# Patient Record
Sex: Male | Born: 1940
Health system: Southern US, Community
[De-identification: ages and names within clinical notes are randomized; demographics above are authoritative.]

## PROBLEM LIST (undated history)

## (undated) DIAGNOSIS — F329 Major depressive disorder, single episode, unspecified: Secondary | ICD-10-CM

## (undated) DIAGNOSIS — F32A Depression, unspecified: Secondary | ICD-10-CM

## (undated) DIAGNOSIS — N390 Urinary tract infection, site not specified: Secondary | ICD-10-CM

## (undated) DIAGNOSIS — F419 Anxiety disorder, unspecified: Secondary | ICD-10-CM

## (undated) DIAGNOSIS — E119 Type 2 diabetes mellitus without complications: Secondary | ICD-10-CM

## (undated) DIAGNOSIS — Z789 Other specified health status: Secondary | ICD-10-CM

## (undated) DIAGNOSIS — T4145XA Adverse effect of unspecified anesthetic, initial encounter: Secondary | ICD-10-CM

## (undated) DIAGNOSIS — F4310 Post-traumatic stress disorder, unspecified: Secondary | ICD-10-CM

## (undated) DIAGNOSIS — Z794 Long term (current) use of insulin: Secondary | ICD-10-CM

## (undated) DIAGNOSIS — M1990 Unspecified osteoarthritis, unspecified site: Secondary | ICD-10-CM

## (undated) DIAGNOSIS — R51 Headache: Secondary | ICD-10-CM

## (undated) DIAGNOSIS — K219 Gastro-esophageal reflux disease without esophagitis: Secondary | ICD-10-CM

## (undated) DIAGNOSIS — K2270 Barrett's esophagus without dysplasia: Secondary | ICD-10-CM

## (undated) DIAGNOSIS — IMO0001 Reserved for inherently not codable concepts without codable children: Secondary | ICD-10-CM

## (undated) DIAGNOSIS — T8859XA Other complications of anesthesia, initial encounter: Secondary | ICD-10-CM

## (undated) DIAGNOSIS — G4730 Sleep apnea, unspecified: Secondary | ICD-10-CM

## (undated) DIAGNOSIS — I10 Essential (primary) hypertension: Secondary | ICD-10-CM

## (undated) DIAGNOSIS — E7800 Pure hypercholesterolemia, unspecified: Secondary | ICD-10-CM

## (undated) DIAGNOSIS — J189 Pneumonia, unspecified organism: Secondary | ICD-10-CM

## (undated) HISTORY — PX: PENILE PROSTHESIS IMPLANT: SHX240

## (undated) HISTORY — PX: CYSTOSCOPY WITH HYDRODISTENSION AND BIOPSY: SHX5127

## (undated) HISTORY — PX: REFRACTIVE SURGERY: SHX103

---

## 1998-10-11 ENCOUNTER — Encounter: Payer: Self-pay | Admitting: *Deleted

## 1998-10-11 ENCOUNTER — Ambulatory Visit (HOSPITAL_COMMUNITY): Admission: RE | Admit: 1998-10-11 | Discharge: 1998-10-11 | Payer: Self-pay | Admitting: *Deleted

## 1998-11-18 ENCOUNTER — Encounter: Payer: Self-pay | Admitting: Endocrinology

## 1998-11-18 ENCOUNTER — Ambulatory Visit (HOSPITAL_COMMUNITY): Admission: RE | Admit: 1998-11-18 | Discharge: 1998-11-18 | Payer: Self-pay | Admitting: Endocrinology

## 1998-11-21 ENCOUNTER — Ambulatory Visit (HOSPITAL_COMMUNITY): Admission: RE | Admit: 1998-11-21 | Discharge: 1998-11-21 | Payer: Self-pay | Admitting: Endocrinology

## 1998-11-21 ENCOUNTER — Encounter: Payer: Self-pay | Admitting: Endocrinology

## 1998-12-06 ENCOUNTER — Ambulatory Visit (HOSPITAL_COMMUNITY): Admission: RE | Admit: 1998-12-06 | Discharge: 1998-12-06 | Payer: Self-pay | Admitting: Urology

## 1998-12-06 ENCOUNTER — Encounter: Payer: Self-pay | Admitting: Urology

## 1999-08-08 ENCOUNTER — Encounter: Payer: Self-pay | Admitting: Urology

## 1999-08-12 ENCOUNTER — Ambulatory Visit (HOSPITAL_COMMUNITY): Admission: RE | Admit: 1999-08-12 | Discharge: 1999-08-12 | Payer: Self-pay | Admitting: Urology

## 1999-08-25 ENCOUNTER — Encounter: Admission: RE | Admit: 1999-08-25 | Discharge: 1999-11-23 | Payer: Self-pay | Admitting: Endocrinology

## 1999-09-09 ENCOUNTER — Ambulatory Visit (HOSPITAL_COMMUNITY): Admission: RE | Admit: 1999-09-09 | Discharge: 1999-09-10 | Payer: Self-pay | Admitting: Urology

## 1999-12-08 ENCOUNTER — Encounter: Admission: RE | Admit: 1999-12-08 | Discharge: 2000-03-07 | Payer: Self-pay | Admitting: Endocrinology

## 2000-05-14 ENCOUNTER — Encounter: Admission: RE | Admit: 2000-05-14 | Discharge: 2000-08-12 | Payer: Self-pay | Admitting: Endocrinology

## 2002-08-20 ENCOUNTER — Emergency Department (HOSPITAL_COMMUNITY): Admission: EM | Admit: 2002-08-20 | Discharge: 2002-08-20 | Payer: Self-pay | Admitting: Emergency Medicine

## 2005-01-12 ENCOUNTER — Emergency Department (HOSPITAL_COMMUNITY): Admission: EM | Admit: 2005-01-12 | Discharge: 2005-01-12 | Payer: Self-pay | Admitting: Emergency Medicine

## 2005-01-12 ENCOUNTER — Encounter (INDEPENDENT_AMBULATORY_CARE_PROVIDER_SITE_OTHER): Payer: Self-pay | Admitting: *Deleted

## 2005-01-12 ENCOUNTER — Ambulatory Visit (HOSPITAL_COMMUNITY): Admission: RE | Admit: 2005-01-12 | Discharge: 2005-01-12 | Payer: Self-pay | Admitting: Urology

## 2005-01-12 ENCOUNTER — Ambulatory Visit (HOSPITAL_BASED_OUTPATIENT_CLINIC_OR_DEPARTMENT_OTHER): Admission: RE | Admit: 2005-01-12 | Discharge: 2005-01-12 | Payer: Self-pay | Admitting: Urology

## 2005-05-20 ENCOUNTER — Ambulatory Visit (HOSPITAL_COMMUNITY): Admission: RE | Admit: 2005-05-20 | Discharge: 2005-05-20 | Payer: Self-pay | Admitting: *Deleted

## 2005-05-20 ENCOUNTER — Encounter (INDEPENDENT_AMBULATORY_CARE_PROVIDER_SITE_OTHER): Payer: Self-pay | Admitting: Specialist

## 2005-10-07 ENCOUNTER — Encounter (INDEPENDENT_AMBULATORY_CARE_PROVIDER_SITE_OTHER): Payer: Self-pay | Admitting: *Deleted

## 2005-10-07 ENCOUNTER — Ambulatory Visit (HOSPITAL_BASED_OUTPATIENT_CLINIC_OR_DEPARTMENT_OTHER): Admission: RE | Admit: 2005-10-07 | Discharge: 2005-10-07 | Payer: Self-pay | Admitting: Urology

## 2006-03-23 ENCOUNTER — Ambulatory Visit (HOSPITAL_BASED_OUTPATIENT_CLINIC_OR_DEPARTMENT_OTHER): Admission: RE | Admit: 2006-03-23 | Discharge: 2006-03-23 | Payer: Self-pay | Admitting: Urology

## 2006-05-22 ENCOUNTER — Emergency Department (HOSPITAL_COMMUNITY): Admission: EM | Admit: 2006-05-22 | Discharge: 2006-05-22 | Payer: Self-pay | Admitting: Emergency Medicine

## 2006-08-11 ENCOUNTER — Ambulatory Visit (HOSPITAL_COMMUNITY): Admission: RE | Admit: 2006-08-11 | Discharge: 2006-08-11 | Payer: Self-pay | Admitting: *Deleted

## 2006-08-11 ENCOUNTER — Encounter (INDEPENDENT_AMBULATORY_CARE_PROVIDER_SITE_OTHER): Payer: Self-pay | Admitting: Specialist

## 2006-12-07 ENCOUNTER — Encounter: Admission: RE | Admit: 2006-12-07 | Discharge: 2007-01-20 | Payer: Self-pay | Admitting: Endocrinology

## 2008-10-09 ENCOUNTER — Ambulatory Visit (HOSPITAL_COMMUNITY): Admission: RE | Admit: 2008-10-09 | Discharge: 2008-10-09 | Payer: Self-pay | Admitting: *Deleted

## 2008-10-09 ENCOUNTER — Encounter (INDEPENDENT_AMBULATORY_CARE_PROVIDER_SITE_OTHER): Payer: Self-pay | Admitting: *Deleted

## 2009-07-06 HISTORY — PX: MOLE REMOVAL: SHX2046

## 2010-10-15 LAB — GLUCOSE, CAPILLARY: Glucose-Capillary: 103 mg/dL — ABNORMAL HIGH (ref 70–99)

## 2010-11-18 NOTE — Op Note (Signed)
Banks, Jesse                ACCOUNT NO.:  [id number]   MEDICAL RECORD NO.:  [id number]          PATIENT TYPE:  AMB   LOCATION:  ENDO                         FACILITY:  [facility]   PHYSICIAN:  Georgiana Spinner, M.D.    DATE OF BIRTH:  [birth year]   DATE OF PROCEDURE:  10/09/2008  DATE OF DISCHARGE:                               OPERATIVE REPORT   PROCEDURE:  Upper endoscopy.   INDICATIONS:  Gastroesophageal reflux disease with Barrett's esophagus.   ANESTHESIA:  Fentanyl 50 mcg, Versed 4 mg.   PROCEDURE:  With the patient mildly sedated in the left lateral  decubitus position the Pentax videoscopic endoscope was inserted into  the mouth, passed under direct vision through the esophagus which  appeared normal until we reached the distal esophagus and there were  changes of Barrett's with islands of Barrett's type tissue seen,  photographed and biopsied.  We then entered into the stomach.  The  fundus, body, antrum, duodenal bulb, second portion duodenum were  visualized. From this point the endoscope was slowly withdrawn taking  circumferential views of duodenal mucosa until the endoscope was pulled  back and the stomach placed in retroflexion to view the stomach from  below. The endoscope was straightened and withdrawn, taking  circumferential views of the remaining gastric and esophageal mucosa.  The patient's vital signs, pulse oximeter remained stable.  The patient  tolerated procedure well without apparent complication.   FINDINGS:  Barrett's esophagus, biopsied.  Await biopsy report.  The  patient will call me for results and follow-up with me as an outpatient.           ______________________________  Georgiana Spinner, M.D.     GMO/MEDQ  D:  10/09/2008  T:  10/09/2008  Job:  [id number]

## 2010-11-21 NOTE — Op Note (Signed)
Banks, DILGER NO.:  [id number]   MEDICAL RECORD NO.:  [id number]          PATIENT TYPE:  AMB   LOCATION:  ENDO                         FACILITY:  [facility]   PHYSICIAN:  Georgiana Spinner, M.D.    DATE OF BIRTH:  08-01-1940   DATE OF PROCEDURE:  08/11/2006  DATE OF DISCHARGE:                               OPERATIVE REPORT   SURGEON:  Georgiana Spinner, M.D.   PROCEDURE:  Upper endoscopy with biopsy.   INDICATIONS:  GERD with Barrett esophagus.   ANESTHESIA:  Fentanyl 50 mcg, Versed 5 mg.   PROCEDURE:  With the patient mildly sedated in the left lateral  decubitus position, the Pentax videoscopic endoscope was inserted in the  mouth and passed under direct vision through the esophagus, which  appeared normal, until we reached the distal esophagus, and there were  apparent changes of Barrett esophagus.  Photographs and biopsies were  taken around the circumference of the squamocolumnar junction.  We  entered into the stomach.  The fundus and body appeared normal.  The  antrum showed some patchy raised erythema, consistent with antral  gastritis, which was photographed and biopsied as well.  The duodenal  bulb and second portion of the duodenum were visualized and appeared  normal.  From this point, the endoscope was slowly withdrawn, taking  circumferential views of the duodenal mucosa, until the endoscope had  been pulled back into the stomach, placed in retroflexion to view the  stomach from below.  The endoscope was straightened and withdrawn,  taking circumferential views of the remaining gastric and esophageal  mucosa.  The patient's vital signs and pulse oximetry remained stable.  The patient tolerated the procedure well without apparent complication.   FINDINGS:  1. Antral gastritis, biopsied.  2. Barrett esophagus, biopsied.   PLAN:  Await biopsy report.  The patient will call for the results and  follow up with me as an outpatient.     ______________________________  Georgiana Spinner, M.D.     GMO/MEDQ  D:  08/11/2006  T:  08/11/2006  Job:  [id number]

## 2010-11-21 NOTE — Op Note (Signed)
Jesse Banks, Jesse Banks                ACCOUNT NO.:  [id number]   MEDICAL RECORD NO.:  [id number]          PATIENT TYPE:  AMB   LOCATION:  [facility]                         FACILITY:  [facility]   PHYSICIAN:  Maretta Bees. Vonita Moss, M.D.DATE OF BIRTH:  1941-05-12   DATE OF PROCEDURE:  DATE OF DISCHARGE:                                 OPERATIVE REPORT   PREOPERATIVE DIAGNOSIS:  Hematuria, rule out carcinoma in situ.   POSTOPERATIVE DIAGNOSES:  1.  Hematuria, rule out carcinoma in situ.  2.  Urethral stricture.   PROCEDURES:  1.  Cystoscopy.  2.  Urethral dilation.  3.  Cold cup bladder biopsy.  4.  Fulguration of bladder surgeon.   SURGEON:  Maretta Bees. Vonita Moss, M.D.   ANESTHESIA:  General.   INDICATIONS:  This 70 year old gentleman had an episode of gross hematuria  last month that was felt probably to be initial in origin. He also has a  history of penile implant for impotency. He has had a history of stones. A  urine culture was negative. A CT scan showed a simple cyst at the lower pole  of the right kidney and a subcentimeter cyst in the anterior left kidney,  but no other renal or ureteral lesions were seen. He was cystoscoped in the  office because of persistent microhematuria and he had an erythematous area  on the dome. He is brought the OR today for a cystoscopy and bladder biopsy.   DESCRIPTION OF PROCEDURE:  The patient was brought to the operating room and  placed in the lithotomy position. The external genitalia were prepped and  draped in the usual fashion. He had hypospadias and penile implant in place.  The urethral meatus was small for a 25 French cystoscope, so he was dilated.  Plus the bulbous urethral stricture was also dilated. I was then able to  cystoscope him and the only lesion in the bladder at this point was a 2 cm,  salmon-colored, slightly raised area on the dome that was worrisome for the  possibility of bladder  carcinoma. Cold cup bladder biopsy forceps were  utilized to biopsy this area  and fulgurate with the Bugbee electrode a 2 cm area on the dome of the  bladder. At this point, the bladder was emptied and the scope removed. The  patient was sent to the recovery room in good condition, having tolerated  the procedure well.     _________________    LJP/MEDQ  D:  01/12/2005  T:  01/12/2005  Job:  [id number]   cc:   Brooke Bonito, M.D.  [address]  [place]  [place] [address]  Fax: [phone]

## 2010-11-21 NOTE — Op Note (Signed)
Jesse Banks, Jesse Banks                ACCOUNT NO.:  [id number]   MEDICAL RECORD NO.:  [id number]          PATIENT TYPE:  AMB   LOCATION:  [facility]                         FACILITY:  [facility]   PHYSICIAN:  Martina Sinner, MD DATE OF BIRTH:  [birth year]   DATE OF PROCEDURE:  03/23/2006  DATE OF DISCHARGE:                                 OPERATIVE REPORT   PREOPERATIVE DIAGNOSIS:  Pelvic pain.   POSTOPERATIVE DIAGNOSES:  1. Pelvic pain.  2. Interstitial cystitis.  3. Mild meatal stenosis.  4. Mild urethral stricture disease.   SURGERY:  1. Cystoscopy.  2. Urethral dilation.  3. Bladder hydrodistention.  4. Bladder instillation therapy.   [name] has a lot of pain with bladder filling and relieved by voiding.  The patient was prepped and draped in usual fashion.  He was given  preoperative antibiotics.   He is hypospadiac and has an inflatable penile prosthesis in place.  In  spite of using a 17-French scope, the meatus was a little bit narrow.  It  was dilated gently a 20-French.  The 17-French scope could easily pass  without splitting the meatus.  He did have some mild proximal bulbar  urethral stricture disease of approximately 17-18 [place].  I was able to  negotiate the scope through that area, dilating it nicely.  I did not have  to use the balloon dilator.  The proximal bulbar urethra and prostatic  urethra looked normal.  It was difficult to scope him since there was  rigidity within the prostatic urethra or penile urethra, probably secondary  to his prosthesis.  I had a good look in his bladder with a 30 and 70-degree  lens before and after hydrodistention.  He was hydrodissected to 1 L.  He  had moderate bleeding upon emptying his bladder.  I had a good look within  his bladder.  He had some glomerulations, surprisingly in the posterior wall  more along the floor, cephalad to the interureteric ridge but none along the  lateral walls or dome.  He did not have a Hunner  ulcer.  No other  abnormalities were noted.   The bladder was emptied.  A 16-French red rubber catheter was inserted,  followed by 15 mL of 0.5% Marcaine and 400 mg of Pyridium.  The catheter was  removed.   The patient was sent to the recovery room and will be followed as per  protocol.   I just switched [name] medical cocktail to Ultram in combination with  Elavil.  I had stopped his Mobic.  He may or may not have stopped his Ativan  depending upon if it allowed him to sleep better.  He has been on Elmiron  for approximately 2 months.  I will see him in the next 1-2 weeks and  proceed accordingly.   A the rectal examination demonstrated a small, benign prostate.           ______________________________  Martina Sinner, MD  Electronically Signed     SAM/MEDQ  D:  03/23/2006  T:  03/24/2006  Job:  [id number]

## 2010-11-21 NOTE — Consult Note (Signed)
Jesse Banks, VERSTRAETE NO.:  [id number]   MEDICAL RECORD NO.:  [id number]          PATIENT TYPE:  EMS   LOCATION:  ED                           FACILITY:  [facility]   PHYSICIAN:  Bertram Millard. Dahlstedt, M.D.DATE OF BIRTH:  1941/03/10   DATE OF CONSULTATION:  05/22/2006  DATE OF DISCHARGE:  05/22/2006                                 CONSULTATION   REASON FOR CONSULTATION:  Persistent erection.   BRIEF HISTORY:  A 70 year old diabetic male, status post placement of  three-piece inflatable penile prosthesis about 7 years ago.  He thinks  that Dr. Isabel Caprice performed it.  He sees Dr. Sherron Monday in our practice  now for bladder problems.   The patient had intercourse last night, inflated his prosthesis and has  not been able to get it down since that time.  He is in a fair amount of  pain.  He called earlier, and I recommended, he come into the emergency  room for me to see him.   PHYSICAL EXAMINATION:  GENERAL:  A pleasant elderly male.  GU: Penis was inflated with the prosthesis.  There was no visible blood  from the meatus.  Penile shaft was nonerythematous.  The pump was  nontender.  After a bit of trying, I, with him squeezing his penis, I  got the implant down by deflating the box at the base of the pump.  We  again reinflated the prosthesis, and we were able to get it back down.  There was a fair amount of pressure applied to the deflation apparatus,  however.   IMPRESSION:  1. Erectile dysfunction, organic, status post inflatable penile      prosthesis placement.  2. Priapism secondary to inability to deflate inflatable penile      prosthesis, now deflated   PLAN:  1. I will have him see Dr. Isabel Caprice or Dr. Sherron Monday to cycle this in      the office.  If it is still hard to deflate, would recommend      replacement of the pump.  2. He was cleared to go home.      Bertram Millard. Dahlstedt, M.D.  Electronically Signed     SMD/MEDQ  D:  05/22/2006  T:   05/22/2006  Job:  [id number]

## 2010-11-21 NOTE — Op Note (Signed)
NAMEXIAN, ALVES                ACCOUNT NO.:  [id number]   MEDICAL RECORD NO.:  [id number]          PATIENT TYPE:  AMB   LOCATION:  ENDO                         FACILITY:  [facility]   PHYSICIAN:  Georgiana Spinner, M.D.    DATE OF BIRTH:  [birth year]   DATE OF PROCEDURE:  05/20/2005  DATE OF DISCHARGE:                                 OPERATIVE REPORT   PROCEDURE:  Upper endoscopy.   INDICATIONS:  Gastroesophageal reflux disease.   ANESTHESIA:  Demerol 40, Versed 4 mg.   PROCEDURE:  With rhe patient mildly sedated in the left lateral decubitus  position, the Olympus videoscopic endoscope was inserted in the mouth and  passed under direct vision through the esophagus which appeared normal until  we reached the distal esophagus and the squamocolumnar junction was somewhat  indistinct and biopsies were taken to rule out Barrett's esophagus. We  entered into the stomach. The fundus, body, antrum, duodenal bulb and second  portion of duodenum were visualized. From this point, the endoscope was  slowly withdrawn taking circumferential views of the duodenal mucosa until  the endoscope had been pulled back into the stomach, placed in retroflexion  to view the stomach from below. The endoscope was then straightened and  withdrawn taking circumferential views of the remaining gastric and  esophageal mucosa stopping in the body of the stomach where diffuse patchy  erythema was seen likened to a mucosal rash and this was photographed and  biopsied. The patient's vital signs and pulse oximeter remained stable. The  patient tolerated the procedure well without apparent complications.   FINDINGS:  Diffuse erythema of gastric body and fundus biopsied, question of  Barrett's esophagus biopsied, await biopsy reports. The patient will call me  for results and follow-up with me as an outpatient.  Proceed to colonoscopy  as planned.           ______________________________  Georgiana Spinner, M.D.     GMO/MEDQ  D:  05/20/2005  T:  05/20/2005  Job:  [id number]

## 2010-11-21 NOTE — Op Note (Signed)
Jesse Banks, Jesse Banks                ACCOUNT NO.:  [id number]   MEDICAL RECORD NO.:  [id number]          PATIENT TYPE:  AMB   LOCATION:  [facility]                         FACILITY:  [facility]   PHYSICIAN:  Maretta Bees. Vonita Moss, M.D.DATE OF BIRTH:  [birth year]   DATE OF PROCEDURE:  10/07/2005  DATE OF DISCHARGE:                                 OPERATIVE REPORT   Procedure: Cystoscopy, Hydrodistension of Bladder and Bladder Biopsy   Surgeon: Dr Larey Dresser   Anes: General   Indication: This gentleman comes in for evaluation to rule out interstitial  cystitis to further work up chronic frequency and nocturia.  He has not  responded well to anticholinergic therapy.  He had a biopsy last fall that  showed a lot of chronic inflammation.  He is still quite symptomatic.  He  has had cytology and a FISH test done and his bladder activity seemed  perhaps too frequent to have urodynamics as a next diagnostic step, so after  discussion with Dr. McDiarmid we both felt repeat to cystoscopy h.o.d. and  bladder biopsy would be appropriate.   PROCEDURE:  The patient brought to the operating room, placed lithotomy  position.  External genitalia were prepped, draped in usual fashion.  He  does have a penile prosthesis in place and a hypospadiac meatus.  The  hypospadiac meatus needed to be dilated to 28-French and then inserted the  cystoscope under direct vision.  The anterior urethra just was slightly  narrow but no discrete strictures after the urethral meatal stricture was  dilated.  The prostate was unremarkable.  The bladder had no stones, tumors  or inflammatory lesions and actually looked better than it did last fall.  I  distended the bladder to a little over 1000 mL and looked back and there was  a lot of hemorrhage and with a combination of 12 and 70 degrees lenses I  localized the bleeding to the anterior wall to inflammatory-looking area.  The rest of bladder did not seem that remarkable and there  were certainly no  papillary tumors or stones.  The hemorrhagic areas on the anterior wall were  then biopsied with cold cup biopsy forceps and the biopsy sites fulgurated  and I fulgurated about a 2 cm area around the biopsied bladder mucosa.  At  this point there was good hemostasis and no active bleeding and the bladder  was emptied, scope removed.  The patient sent to recovery room in good  condition having tolerated the procedure well.      Maretta Bees. Vonita Moss, M.D.  Electronically Signed     LJP/MEDQ  D:  10/07/2005  T:  10/08/2005  Job:  [id number]

## 2010-11-21 NOTE — Op Note (Signed)
NAMEHALTON, NEAS                ACCOUNT NO.:  [id number]   MEDICAL RECORD NO.:  [id number]          PATIENT TYPE:  AMB   LOCATION:  ENDO                         FACILITY:  [facility]   PHYSICIAN:  Georgiana Spinner, M.D.    DATE OF BIRTH:  [birth year]   DATE OF PROCEDURE:  05/20/2005  DATE OF DISCHARGE:                                 OPERATIVE REPORT   PROCEDURE:  Colonoscopy.   INDICATIONS:  Colon polyp.   ANESTHESIA:  Demerol 20, Versed 2 mg.   PROCEDURE:  With the patient mildly sedated in the left lateral decubitus  position, the Olympus videoscopic colonoscope was inserted in the rectum and  passed under direct vision to the cecum identified by the ileocecal valve  and appendiceal orifice both of which were photographed. From this point,  colonoscope was slowly withdrawn taking circumferential views of the colonic  mucosa stopping only at approximately 20 cm from the anal verge at which  point we saw a question of a polyp versus an inverted diverticulum. This was  photographed and just a mucosal biopsy was taken. The endoscope was then  withdrawn all the way to the rectum which appeared normal on direct but  showed hemorrhoidal tissue on retroflexed view. The endoscope was  straightened and withdrawn. The patient's vital signs and pulse oximeter  remained stable. The patient tolerated the procedure well without apparent  complications.   FINDINGS:  Question of polyp at 20 cm from the anal verge biopsied, await  biopsy report. The patient will call me for results and follow-up with me as  an outpatient. See endoscopy note for further details.           ______________________________  Georgiana Spinner, M.D.     GMO/MEDQ  D:  05/20/2005  T:  05/20/2005  Job:  [id number]

## 2011-04-20 ENCOUNTER — Other Ambulatory Visit: Payer: Self-pay | Admitting: Endocrinology

## 2011-04-20 DIAGNOSIS — M545 Low back pain: Secondary | ICD-10-CM

## 2011-04-24 ENCOUNTER — Ambulatory Visit
Admission: RE | Admit: 2011-04-24 | Discharge: 2011-04-24 | Disposition: A | Discharge status: Home / Self Care | Payer: Medicare Other | Source: Ambulatory Visit | Attending: Endocrinology | Admitting: Endocrinology

## 2011-04-24 DIAGNOSIS — M545 Low back pain, unspecified: Secondary | ICD-10-CM

## 2012-02-22 ENCOUNTER — Other Ambulatory Visit: Payer: Self-pay | Admitting: Gastroenterology

## 2013-07-28 ENCOUNTER — Encounter (HOSPITAL_COMMUNITY): Payer: Self-pay | Admitting: Emergency Medicine

## 2013-07-28 ENCOUNTER — Emergency Department (INDEPENDENT_AMBULATORY_CARE_PROVIDER_SITE_OTHER)
Admission: EM | Admit: 2013-07-28 | Discharge: 2013-07-28 | Disposition: A | Discharge status: Home / Self Care | Payer: Medicare Other | Source: Home / Self Care | Attending: Family Medicine | Admitting: Family Medicine

## 2013-07-28 DIAGNOSIS — R7309 Other abnormal glucose: Secondary | ICD-10-CM

## 2013-07-28 DIAGNOSIS — H538 Other visual disturbances: Secondary | ICD-10-CM

## 2013-07-28 DIAGNOSIS — R739 Hyperglycemia, unspecified: Secondary | ICD-10-CM

## 2013-07-28 HISTORY — DX: Pure hypercholesterolemia, unspecified: E78.00

## 2013-07-28 HISTORY — DX: Essential (primary) hypertension: I10

## 2013-07-28 LAB — POCT I-STAT, CHEM 8
BUN: 21 mg/dL (ref 6–23)
CREATININE: 1.5 mg/dL — AB (ref 0.50–1.35)
Calcium, Ion: 1.23 mmol/L (ref 1.13–1.30)
Chloride: 97 mEq/L (ref 96–112)
GLUCOSE: 269 mg/dL — AB (ref 70–99)
HCT: 35 % — ABNORMAL LOW (ref 39.0–52.0)
Hemoglobin: 11.9 g/dL — ABNORMAL LOW (ref 13.0–17.0)
Potassium: 4.3 mEq/L (ref 3.7–5.3)
Sodium: 136 mEq/L — ABNORMAL LOW (ref 137–147)
TCO2: 28 mmol/L (ref 0–100)

## 2013-07-28 NOTE — ED Notes (Signed)
Patient has been seeing bouncing, colorful lights , flashing since 10 a.m sees lights in both eyes and if eyes are closed, still sees flashes.  Patient has high readings for sugar: 5:00pm-352, 7:20 pm -305.  Patient stopped taking medicines 1/14, restarted on 1/20.

## 2013-07-28 NOTE — ED Provider Notes (Signed)
Jesse Banks is a 73 y.o. male who presents to Urgent Care today for elevated blood sugar and flashing lights. Patient developed flashing multiple colored lights in both the left and right visual field starting at about 10:00 this morning. He notes that it seems to be a little more prevalent on the left than the right. He denies any new blurry vision or loss of visual acuity or visual field. He denies any significant headache weakness numbness loss of function. His daughter in law states that he seems to be acting normally.  However he notes that his blood sugar has been elevated over the past several days. He was not taking his Byetta or insulin from the 14th to the 20th and recently restarted both. He denies any significant polyuria and polydipsia. He notes that his diet is high in carbohydrates.  Patient takes insulin only after dinner.   Past Medical History  Diagnosis Date  . Diabetes mellitus without complication   . High cholesterol   . Hypertension    History  Substance Use Topics  . Smoking status: Never Smoker   . Smokeless tobacco: Not on file  . Alcohol Use: No   ROS as above Medications: No current facility-administered medications for this encounter.   Current Outpatient Prescriptions  Medication Sig Dispense Refill  . Exenatide (BYETTA 10 MCG PEN [place]) Inject into the skin.      . Insulin Glulisine (APIDRA Bessie) Inject 20 Units into the skin.      . Rosuvastatin Calcium (CRESTOR PO) Take by mouth.        Exam:  BP 165/50  Pulse 80  Temp(Src) 99.2 F (37.3 C) (Oral)  Resp 16  SpO2 100% Gen: Well NAD HEENT: EOMI,  MMM, PERRLA bilateral.  Fungus are difficult to visualize bilaterally. This is due to glare from mild cataracts. Occasional retinal vessels are visualized.  The visual field appears to be uniform in normal.  Visual acuity 20/50 right, left, and with both eyes.   Lungs: Normal work of breathing. CTABL Heart: RRR no MRG Abd: NABS, Soft. NT, ND Exts:  Brisk capillary refill, warm and well perfused.  Neuro: Alert and oriented normal coordination gait and balance. Cranial nerves II through XII are intact throughout.  Results for orders placed during the hospital encounter of 07/28/13 (from the past 24 hour(s))  POCT I-STAT, CHEM 8     Status: Abnormal   Collection Time    07/28/13  8:15 PM      Result Value Range   Sodium 136 (*) 137 - 147 mEq/L   Potassium 4.3  3.7 - 5.3 mEq/L   Chloride 97  96 - 112 mEq/L   BUN 21  6 - 23 mg/dL   Creatinine, Ser 1.50 (*) 0.50 - 1.35 mg/dL   Glucose, Bld 269 (*) 70 - 99 mg/dL   Calcium, Ion 1.23  1.13 - 1.30 mmol/L   TCO2 28  0 - 100 mmol/L   Hemoglobin 11.9 (*) 13.0 - 17.0 g/dL   HCT 35.0 (*) 39.0 - 52.0 %   No results found.  Assessment and Plan: 73 y.o. male with  1) flashing lights in his vision. He does not have significant discrepancy of his visual acuity nor does he have significant loss of his visual field.  I discussed the case with Dr. Arnoldo Morale.  Patient will followup at [facility] Monday morning. If he becomes more symptomatic he will go directly to the emergency room.  I am very doubtful that  he is having a stroke or retinal detachment.  I believe stroke is unlikely as his symptoms are intermittent and not worsening. Additionally the flashing lights involve both the right and left visual fields is to be unlikely for a cerebrovascular accident. He also does not have any significant visual field deficit that I can appreciate tonight.   2) hyperglycemia: I believe this is more related to inadequate insulin to meet glucose demands.  Plan to increase insulin and follow up with PCP.   Discussed warning signs or symptoms. Please see discharge instructions. Patient expresses understanding.    Gregor Hams, MD 07/28/13 2129

## 2013-07-28 NOTE — Discharge Instructions (Signed)
Thank you for coming in today. Be seen at [facility] eye care Monday morning.  If you get worse go directly to the emergency room at [facility].  If you have questions you can call Dr. Arnoldo Morale ophthalmologist at [phone]  Start taking your insulin 10 units after lunch and 20 units after dinner. Check your blood sugar carefully 3 times a day.  Followup with your primary care Dr.

## 2013-08-24 ENCOUNTER — Emergency Department (HOSPITAL_COMMUNITY): Payer: Medicare Other

## 2013-08-24 ENCOUNTER — Inpatient Hospital Stay (HOSPITAL_COMMUNITY): Payer: Medicare Other

## 2013-08-24 ENCOUNTER — Encounter (HOSPITAL_COMMUNITY): Payer: Self-pay | Admitting: Emergency Medicine

## 2013-08-24 ENCOUNTER — Inpatient Hospital Stay (HOSPITAL_COMMUNITY)
Admission: EM | Admit: 2013-08-24 | Discharge: 2013-09-03 | DRG: 435 | Disposition: E | Payer: Medicare Other | Attending: Internal Medicine | Admitting: Internal Medicine

## 2013-08-24 DIAGNOSIS — G9341 Metabolic encephalopathy: Secondary | ICD-10-CM | POA: Diagnosis present

## 2013-08-24 DIAGNOSIS — Z66 Do not resuscitate: Secondary | ICD-10-CM | POA: Diagnosis present

## 2013-08-24 DIAGNOSIS — Z515 Encounter for palliative care: Secondary | ICD-10-CM

## 2013-08-24 DIAGNOSIS — E119 Type 2 diabetes mellitus without complications: Secondary | ICD-10-CM

## 2013-08-24 DIAGNOSIS — R82998 Other abnormal findings in urine: Secondary | ICD-10-CM | POA: Diagnosis present

## 2013-08-24 DIAGNOSIS — K869 Disease of pancreas, unspecified: Secondary | ICD-10-CM | POA: Diagnosis present

## 2013-08-24 DIAGNOSIS — I4510 Unspecified right bundle-branch block: Secondary | ICD-10-CM | POA: Diagnosis present

## 2013-08-24 DIAGNOSIS — R509 Fever, unspecified: Secondary | ICD-10-CM | POA: Diagnosis present

## 2013-08-24 DIAGNOSIS — K859 Acute pancreatitis without necrosis or infection, unspecified: Secondary | ICD-10-CM | POA: Diagnosis present

## 2013-08-24 DIAGNOSIS — E1142 Type 2 diabetes mellitus with diabetic polyneuropathy: Secondary | ICD-10-CM | POA: Diagnosis present

## 2013-08-24 DIAGNOSIS — R7402 Elevation of levels of lactic acid dehydrogenase (LDH): Secondary | ICD-10-CM | POA: Diagnosis present

## 2013-08-24 DIAGNOSIS — I129 Hypertensive chronic kidney disease with stage 1 through stage 4 chronic kidney disease, or unspecified chronic kidney disease: Secondary | ICD-10-CM | POA: Diagnosis present

## 2013-08-24 DIAGNOSIS — K7200 Acute and subacute hepatic failure without coma: Secondary | ICD-10-CM | POA: Diagnosis present

## 2013-08-24 DIAGNOSIS — N319 Neuromuscular dysfunction of bladder, unspecified: Secondary | ICD-10-CM | POA: Diagnosis present

## 2013-08-24 DIAGNOSIS — W19XXXA Unspecified fall, initial encounter: Secondary | ICD-10-CM | POA: Diagnosis present

## 2013-08-24 DIAGNOSIS — R4020 Unspecified coma: Secondary | ICD-10-CM | POA: Diagnosis present

## 2013-08-24 DIAGNOSIS — C787 Secondary malignant neoplasm of liver and intrahepatic bile duct: Secondary | ICD-10-CM

## 2013-08-24 DIAGNOSIS — E785 Hyperlipidemia, unspecified: Secondary | ICD-10-CM | POA: Diagnosis present

## 2013-08-24 DIAGNOSIS — Z794 Long term (current) use of insulin: Secondary | ICD-10-CM

## 2013-08-24 DIAGNOSIS — C259 Malignant neoplasm of pancreas, unspecified: Principal | ICD-10-CM | POA: Diagnosis present

## 2013-08-24 DIAGNOSIS — N179 Acute kidney failure, unspecified: Secondary | ICD-10-CM | POA: Diagnosis present

## 2013-08-24 DIAGNOSIS — I81 Portal vein thrombosis: Secondary | ICD-10-CM | POA: Diagnosis present

## 2013-08-24 DIAGNOSIS — R4182 Altered mental status, unspecified: Secondary | ICD-10-CM | POA: Diagnosis present

## 2013-08-24 DIAGNOSIS — E1149 Type 2 diabetes mellitus with other diabetic neurological complication: Secondary | ICD-10-CM | POA: Diagnosis present

## 2013-08-24 DIAGNOSIS — E870 Hyperosmolality and hypernatremia: Secondary | ICD-10-CM | POA: Diagnosis not present

## 2013-08-24 DIAGNOSIS — Z79899 Other long term (current) drug therapy: Secondary | ICD-10-CM

## 2013-08-24 DIAGNOSIS — G609 Hereditary and idiopathic neuropathy, unspecified: Secondary | ICD-10-CM | POA: Diagnosis present

## 2013-08-24 DIAGNOSIS — N182 Chronic kidney disease, stage 2 (mild): Secondary | ICD-10-CM | POA: Diagnosis present

## 2013-08-24 DIAGNOSIS — R7401 Elevation of levels of liver transaminase levels: Secondary | ICD-10-CM | POA: Diagnosis present

## 2013-08-24 DIAGNOSIS — R1011 Right upper quadrant pain: Secondary | ICD-10-CM

## 2013-08-24 DIAGNOSIS — I10 Essential (primary) hypertension: Secondary | ICD-10-CM | POA: Diagnosis present

## 2013-08-24 DIAGNOSIS — N390 Urinary tract infection, site not specified: Secondary | ICD-10-CM | POA: Diagnosis present

## 2013-08-24 DIAGNOSIS — IMO0001 Reserved for inherently not codable concepts without codable children: Secondary | ICD-10-CM

## 2013-08-24 DIAGNOSIS — R740 Nonspecific elevation of levels of transaminase and lactic acid dehydrogenase [LDH]: Secondary | ICD-10-CM

## 2013-08-24 HISTORY — DX: Adverse effect of unspecified anesthetic, initial encounter: T41.45XA

## 2013-08-24 HISTORY — DX: Major depressive disorder, single episode, unspecified: F32.9

## 2013-08-24 HISTORY — DX: Type 2 diabetes mellitus without complications: E11.9

## 2013-08-24 HISTORY — DX: Anxiety disorder, unspecified: F41.9

## 2013-08-24 HISTORY — DX: Reserved for inherently not codable concepts without codable children: IMO0001

## 2013-08-24 HISTORY — DX: Sleep apnea, unspecified: G47.30

## 2013-08-24 HISTORY — DX: Headache: R51

## 2013-08-24 HISTORY — DX: Barrett's esophagus without dysplasia: K22.70

## 2013-08-24 HISTORY — DX: Gastro-esophageal reflux disease without esophagitis: K21.9

## 2013-08-24 HISTORY — DX: Urinary tract infection, site not specified: N39.0

## 2013-08-24 HISTORY — DX: Other complications of anesthesia, initial encounter: T88.59XA

## 2013-08-24 HISTORY — DX: Unspecified osteoarthritis, unspecified site: M19.90

## 2013-08-24 HISTORY — DX: Post-traumatic stress disorder, unspecified: F43.10

## 2013-08-24 HISTORY — DX: Long term (current) use of insulin: Z79.4

## 2013-08-24 HISTORY — DX: Other specified health status: Z78.9

## 2013-08-24 HISTORY — DX: Depression, unspecified: F32.A

## 2013-08-24 HISTORY — DX: Pneumonia, unspecified organism: J18.9

## 2013-08-24 LAB — COMPREHENSIVE METABOLIC PANEL
ALBUMIN: 2.8 g/dL — AB (ref 3.5–5.2)
ALT: 199 U/L — ABNORMAL HIGH (ref 0–53)
AST: 573 U/L — AB (ref 0–37)
Alkaline Phosphatase: 161 U/L — ABNORMAL HIGH (ref 39–117)
BUN: 35 mg/dL — ABNORMAL HIGH (ref 6–23)
CO2: 24 mEq/L (ref 19–32)
CREATININE: 1.42 mg/dL — AB (ref 0.50–1.35)
Calcium: >15 mg/dL (ref 8.4–10.5)
Chloride: 99 mEq/L (ref 96–112)
GFR calc Af Amer: 56 mL/min — ABNORMAL LOW (ref >90)
GFR, EST NON AFRICAN AMERICAN: 49 mL/min — AB (ref >90)
Glucose, Bld: 105 mg/dL — ABNORMAL HIGH (ref 70–99)
Potassium: 4.6 mEq/L (ref 3.7–5.3)
Sodium: 139 mEq/L (ref 137–147)
Total Bilirubin: 0.7 mg/dL (ref 0.3–1.2)
Total Protein: 6.8 g/dL (ref 6.0–8.3)

## 2013-08-24 LAB — URINE MICROSCOPIC-ADD ON

## 2013-08-24 LAB — CBC
HCT: 32.7 % — ABNORMAL LOW (ref 39.0–52.0)
Hemoglobin: 10.4 g/dL — ABNORMAL LOW (ref 13.0–17.0)
MCH: 25.4 pg — AB (ref 26.0–34.0)
MCHC: 31.8 g/dL (ref 30.0–36.0)
MCV: 80 fL (ref 78.0–100.0)
Platelets: 294 10*3/uL (ref 150–400)
RBC: 4.09 MIL/uL — ABNORMAL LOW (ref 4.22–5.81)
RDW: 14.7 % (ref 11.5–15.5)
WBC: 10.4 10*3/uL (ref 4.0–10.5)

## 2013-08-24 LAB — TROPONIN I

## 2013-08-24 LAB — AMMONIA: AMMONIA: 72 umol/L — AB (ref 11–60)

## 2013-08-24 LAB — URINALYSIS, ROUTINE W REFLEX MICROSCOPIC
BILIRUBIN URINE: NEGATIVE
Glucose, UA: NEGATIVE mg/dL
Ketones, ur: NEGATIVE mg/dL
Nitrite: POSITIVE — AB
PROTEIN: 100 mg/dL — AB
Specific Gravity, Urine: 1.016 (ref 1.005–1.030)
UROBILINOGEN UA: 1 mg/dL (ref 0.0–1.0)
pH: 5 (ref 5.0–8.0)

## 2013-08-24 LAB — I-STAT CG4 LACTIC ACID, ED: LACTIC ACID, VENOUS: 2.45 mmol/L — AB (ref 0.5–2.2)

## 2013-08-24 LAB — GLUCOSE, CAPILLARY: GLUCOSE-CAPILLARY: 103 mg/dL — AB (ref 70–99)

## 2013-08-24 LAB — PROTIME-INR
INR: 1.25 (ref 0.00–1.49)
Prothrombin Time: 15.4 seconds — ABNORMAL HIGH (ref 11.6–15.2)

## 2013-08-24 LAB — CBG MONITORING, ED
Glucose-Capillary: 100 mg/dL — ABNORMAL HIGH (ref 70–99)
Glucose-Capillary: 140 mg/dL — ABNORMAL HIGH (ref 70–99)

## 2013-08-24 MED ORDER — FUROSEMIDE 10 MG/ML IJ SOLN
40.0000 mg | Freq: Two times a day (BID) | INTRAMUSCULAR | Status: DC
  Administered 2013-08-24 – 2013-08-25 (×3): 40 mg via INTRAVENOUS
  Filled 2013-08-24 (×4): qty 4

## 2013-08-24 MED ORDER — SODIUM CHLORIDE 0.9 % IV SOLN
1000.0000 mL | INTRAVENOUS | Status: DC
  Administered 2013-08-24: 1000 mL via INTRAVENOUS

## 2013-08-24 MED ORDER — SODIUM CHLORIDE 0.9 % IV SOLN
INTRAVENOUS | Status: DC
  Administered 2013-08-24 – 2013-08-25 (×2): via INTRAVENOUS

## 2013-08-24 MED ORDER — SODIUM CHLORIDE 0.9 % IV SOLN
INTRAVENOUS | Status: AC
Start: 2013-08-24 — End: 2013-08-25
  Administered 2013-08-24: 22:00:00 via INTRAVENOUS

## 2013-08-24 MED ORDER — FUROSEMIDE 10 MG/ML IJ SOLN
40.0000 mg | Freq: Once | INTRAMUSCULAR | Status: AC
  Administered 2013-08-24: 40 mg via INTRAVENOUS
  Filled 2013-08-24: qty 4

## 2013-08-24 MED ORDER — SODIUM CHLORIDE 0.9 % IV BOLUS (SEPSIS)
1000.0000 mL | Freq: Once | INTRAVENOUS | Status: AC
  Administered 2013-08-24: 1000 mL via INTRAVENOUS

## 2013-08-24 MED ORDER — CEFTRIAXONE SODIUM 1 G IJ SOLR
1.0000 g | INTRAMUSCULAR | Status: DC
  Filled 2013-08-24: qty 10

## 2013-08-24 MED ORDER — METOPROLOL TARTRATE 1 MG/ML IV SOLN
5.0000 mg | Freq: Four times a day (QID) | INTRAVENOUS | Status: DC
  Administered 2013-08-25 (×2): 5 mg via INTRAVENOUS
  Filled 2013-08-24 (×6): qty 5

## 2013-08-24 MED ORDER — HYDRALAZINE HCL 20 MG/ML IJ SOLN
5.0000 mg | INTRAMUSCULAR | Status: DC | PRN
  Administered 2013-08-24 – 2013-08-25 (×2): 5 mg via INTRAVENOUS
  Filled 2013-08-24 (×2): qty 1

## 2013-08-24 MED ORDER — ONDANSETRON HCL 4 MG/2ML IJ SOLN: 4.0000 mg | Freq: Four times a day (QID) | INTRAMUSCULAR | Status: DC | PRN

## 2013-08-24 MED ORDER — ONDANSETRON HCL 4 MG PO TABS: 4.0000 mg | ORAL_TABLET | Freq: Four times a day (QID) | ORAL | Status: DC | PRN

## 2013-08-24 MED ORDER — CLONIDINE HCL 0.2 MG/24HR TD PTWK
0.2000 mg | MEDICATED_PATCH | TRANSDERMAL | Status: DC
  Administered 2013-08-24: 0.2 mg via TRANSDERMAL
  Filled 2013-08-24: qty 1

## 2013-08-24 MED ORDER — SODIUM CHLORIDE 0.9 % IV SOLN
1000.0000 mL | Freq: Once | INTRAVENOUS | Status: AC
  Administered 2013-08-24: 1000 mL via INTRAVENOUS

## 2013-08-24 MED ORDER — INSULIN ASPART 100 UNIT/ML ~~LOC~~ SOLN: 0.0000 [IU] | SUBCUTANEOUS | Status: DC

## 2013-08-24 MED ORDER — IOHEXOL 300 MG/ML  SOLN
80.0000 mL | Freq: Once | INTRAMUSCULAR | Status: AC | PRN
  Administered 2013-08-24: 80 mL via INTRAVENOUS

## 2013-08-24 MED ORDER — SODIUM CHLORIDE 0.9 % IV SOLN
60.0000 mg | Freq: Once | INTRAVENOUS | Status: AC
  Administered 2013-08-24: 60 mg via INTRAVENOUS
  Filled 2013-08-24: qty 20

## 2013-08-24 MED ORDER — LORAZEPAM 2 MG/ML IJ SOLN
1.0000 mg | Freq: Four times a day (QID) | INTRAMUSCULAR | Status: DC | PRN
  Filled 2013-08-24: qty 1

## 2013-08-24 MED ORDER — DEXTROSE 5 % IV SOLN
2.0000 g | Freq: Once | INTRAVENOUS | Status: AC
  Administered 2013-08-24: 2 g via INTRAVENOUS
  Filled 2013-08-24: qty 2

## 2013-08-24 MED ORDER — DEXTROSE 10 % IV BOLUS
250.0000 mL | Freq: Once | INTRAVENOUS | Status: AC
  Administered 2013-08-24: 250 mL via INTRAVENOUS

## 2013-08-24 MED ORDER — MORPHINE SULFATE 2 MG/ML IJ SOLN: 2.0000 mg | INTRAMUSCULAR | Status: DC | PRN

## 2013-08-24 NOTE — H&P (Signed)
[facility] History and Physical  GAYNOR FERRERAS EUM:[id number] DOB: 31-Jan-1941 DOA: 09-19-13  Referring physician: Abigail Butts, ER PA PCP: Dwan Bolt, MD   Chief Complaint: Acute confusion  HPI: Jesse Banks is a 73 y.o. male  Patient is a 73 year old white male past we'll history diabetes mellitus with secondary peripheral neuropathy and hypertension who lives at home but has family nearby and was found by daughter in law today after she was unable to contact him x1-2 days. Patient was found to be supine on the bathroom floor. Initially he was difficult to respond, but the time EMS arrived he was more alert and oriented. His daughter, however felt that he was still somewhat confused and he was brought into the emergency room. Upon arrival to the emergency room, he was evaluated and lab work noted a lactic acid level of 2.45, a minimal anion gap of 15 with a BUN of 35 and creatinine of 1.42-unclear baseline, transaminases with a a ST of 573 and ALT of 199, alkaline phosphatase of 161. Most concerning was a calcium level of greater than 15-unable to get number. Patient also noted to have a UTI. An abdominal ultrasound was concerning for metastatic liver lesions. Hospitalists were contacted and case was discussed. 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 An abdominal and chest CT were done which noted clear lung fields but evidence of a pancreatic mass in the tail extending to the splenic vein. 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  Patient was started on aggressive IV fluids. He is also noted to have malignant hypertension with systolic blood pressure of 180.   Review of Systems:  Patient try somnolent and unable to awaken, unable to get review of systems  Past Medical History  Diagnosis Date  . Diabetes mellitus with peripheral neuropathy    . High cholesterol   . Hypertension   Neurogenic bladder  requiring frequent self catheterizations  History reviewed. No pertinent past surgical history. Social History:  reports that he has never smoked. He does not have any smokeless tobacco history on file. He reports that he does not drink alcohol or use illicit drugs. his baseline is that he lives at home by himself with family nearby. He is normally able to ambulate with little if any assistance.  No Known Allergies  Family history: Hypertension  Prior to Admission medications   Medication Sig Start Date End Date Taking? Authorizing Provider  desmopressin (DDAVP) 0.2 MG tablet Take 0.2 mg by mouth daily.   Yes Historical Provider, MD  Exenatide (BYETTA 10 MCG PEN [place]) Inject 10 Units into the skin daily.    Yes Historical Provider, MD  fenofibrate (TRICOR) 145 MG tablet Take 145 mg by mouth daily.   Yes Historical Provider, MD  furosemide (LASIX) 40 MG tablet Take 40 mg by mouth.   Yes Historical Provider, MD  HYDROcodone-acetaminophen (NORCO/VICODIN) 5-325 MG per tablet Take 1 tablet by mouth every 6 (six) hours as needed for moderate pain.   Yes Historical Provider, MD  hydrOXYzine (ATARAX/VISTARIL) 25 MG tablet Take 25 mg by mouth at bedtime.   Yes Historical Provider, MD  Insulin Glulisine (APIDRA [place]) Inject 20 Units into the skin.   Yes Historical Provider, MD  isosorbide mononitrate (IMDUR) 30 MG 24 hr tablet Take 30 mg by mouth daily.   Yes Historical Provider, MD  lisinopril (PRINIVIL,ZESTRIL) 10 MG tablet Take 10 mg by mouth daily.   Yes Historical  Provider, MD  metFORMIN (GLUMETZA) 500 MG (MOD) 24 hr tablet Take 500 mg by mouth 2 (two) times daily with a meal.   Yes Historical Provider, MD  MYRBETRIQ 50 MG TB24 tablet Take 50 mg by mouth daily. 08/13/13  Yes Historical Provider, MD  omeprazole (PRILOSEC) 40 MG capsule Take 40 mg by mouth daily.   Yes Historical Provider, MD  oxybutynin (DITROPAN XL) 15 MG 24 hr tablet Take 15 mg by mouth at bedtime.   Yes Historical Provider, MD    pentosan polysulfate (ELMIRON) 100 MG capsule Take 100 mg by mouth 3 (three) times daily.   Yes Historical Provider, MD  pregabalin (LYRICA) 50 MG capsule Take 50 mg by mouth 2 (two) times daily.   Yes Historical Provider, MD  rosuvastatin (CRESTOR) 20 MG tablet Take 20 mg by mouth daily.   Yes Historical Provider, MD  senna-docusate (SENOKOT-S) 8.6-50 MG per tablet Take 1 tablet by mouth daily.   Yes Historical Provider, MD   Physical Exam: Filed Vitals:   2013/09/17 1935  BP: 182/78  Pulse: 87  Temp: 98.3 F (36.8 C)  Resp: 28    BP 182/78  Pulse 87  Temp(Src) 98.3 F (36.8 C) (Oral)  Resp 28  Ht 5\' 6"  (1.676 m)  Wt 84.1 kg (185 lb 6.5 oz)  BMI 29.94 kg/m2  SpO2 96%  General:  Somnolent, difficult to awaken HEENT: Normocephalic, atraumatic, mucous her meds are dry Cardiovascular: Regular rate and rhythm, S1-S2 Lungs: Clear to auscultation bilaterally Abdomen: Soft, nontender, nondistended, positive bowel sounds Extremities: No clubbing cyanosis or edema Psych: Patient currently somnolent Neuro: Nonfocal, difficult exam secondary to patient's somnolence           Labs on Admission:  Basic Metabolic Panel:  Recent Labs Lab 2013/09/17 1254  NA 139  K 4.6  CL 99  CO2 24  GLUCOSE 105*  BUN 35*  CREATININE 1.42*  CALCIUM >15.0*   Liver Function Tests:  Recent Labs Lab Sep 17, 2013 1254  AST 573*  ALT 199*  ALKPHOS 161*  BILITOT 0.7  PROT 6.8  ALBUMIN 2.8*   No results found for this basename: LIPASE, AMYLASE,  in the last 168 hours No results found for this basename: AMMONIA,  in the last 168 hours CBC:  Recent Labs Lab 09-17-2013 1254  WBC 10.4  HGB 10.4*  HCT 32.7*  MCV 80.0  PLT 294   Cardiac Enzymes:  Recent Labs Lab 17-Sep-2013 1254  TROPONINI <0.30    BNP (last 3 results) No results found for this basename: PROBNP,  in the last 8760 hours CBG:  Recent Labs Lab 2013/09/17 1405 09-17-2013 1612  GLUCAP 100* 140*    Radiological Exams on  Admission: Dg Chest 1 View  2013-09-17   CLINICAL DATA:  Pain post trauma  EXAM: CHEST - 1 VIEW  COMPARISON:  May 10, 2013  FINDINGS: There is no edema or consolidation. Heart size and pulmonary vascular normal. No adenopathy. No pneumothorax. No bone lesions.  IMPRESSION: No edema or consolidation.  No pneumothorax.   Electronically Signed   By: Bretta Bang M.D.   On: 09-17-13 13:49   Dg Lumbar Spine Complete  2013-09-17   CLINICAL DATA:  Low back pain after fall.  EXAM: LUMBAR SPINE - COMPLETE 4+ VIEW  COMPARISON:  CT scan of April 24, 2011.  FINDINGS: No fracture or spondylolisthesis is noted. Disc spaces are well-maintained. Mild degenerative joint disease is seen involving posterior facet joints of L5-S1. Sclerotic density is noted  at L2 vertebral body which corresponds to benign enostosis seen on prior CT scan. Minimal anterior osteophyte formation is noted at T12-L1, L2-3 and L3-4.  IMPRESSION: No acute abnormality seen in the lumbar spine.   Electronically Signed   By: Sabino Dick M.D.   On: 08/30/2013 13:51   Ct Head Wo Contrast  09/01/2013   CLINICAL DATA:  Fall.  Lethargy and disoriented.  EXAM: CT HEAD WITHOUT CONTRAST  CT CERVICAL SPINE WITHOUT CONTRAST  TECHNIQUE: Multidetector CT imaging of the head and cervical spine was performed following the standard protocol without intravenous contrast. Multiplanar CT image reconstructions of the cervical spine were also generated.  COMPARISON:  None.  FINDINGS: CT HEAD FINDINGS  A low-density cutaneous nodule is present posterior to the right ear measuring 2.0 x 1.6 cm. Moderate generalized atrophy and white matter disease is present bilaterally. The ventricles are proportionate to the degree of atrophy. No significant extra-axial fluid collection is present. No hemorrhage or mass lesion is evident. No acute infarct is present.  Polyps or mucous retention cysts are present in the left maxillary sinus. The remaining paranasal sinuses and  the mastoid air cells are clear. The osseous skull is intact.  CT CERVICAL SPINE FINDINGS  Cervical spine is imaged and skull base through T3-4. Vertebral body heights and alignment are normal. Degenerate endplate changes and uncovertebral spurring is most evident at C6-7. No acute fracture or traumatic subluxation is present. The soft tissues the neck demonstrate atherosclerotic calcifications at the carotid bifurcations. Atherosclerotic calcifications are also present at the dural margin of the vertebral arteries bilaterally. Mild dependent changes are present at the lung apices bilaterally.  IMPRESSION: 1. Moderate atrophy and white matter disease. 2. No acute intracranial abnormality. 3. 2 cm hypodense nodule posterior to the right ear likely represents a sebaceous cyst. 4. Mild degenerative changes at the cervical spine are most evident at C6-7. 5. No acute fracture or traumatic subluxation in the cervical spine.   Electronically Signed   By: Lawrence Santiago M.D.   On: 08/10/2013 15:37   Ct Chest W Contrast  08/06/2013   CLINICAL DATA:  Liver metastases.  EXAM: CT CHEST, ABDOMEN, AND PELVIS WITH CONTRAST  TECHNIQUE: Multidetector CT imaging of the chest, abdomen and pelvis was performed following the standard protocol during bolus administration of intravenous contrast.  CONTRAST:  9mL OMNIPAQUE IOHEXOL 300 MG/ML  SOLN  COMPARISON:  None.  FINDINGS: CT CHEST FINDINGS  Minimal subsegmental atelectasis is noted posteriorly in both lung bases. No pneumothorax or pleural effusion is noted. No mass or adenopathy is noted in the mediastinum. Coronary artery calcifications are noted. Thoracic aorta and pulmonary arteries appear grossly normal.  CT ABDOMEN AND PELVIS FINDINGS  Multiple rounded low densities are noted throughout the hepatic parenchyma consistent with metastatic disease. The left portal vein appears patent. The right portal vein appears to be occluded presumably by tumor. No gallstones are noted. 3.8  x 2.5 cm lobulated low density is noted in the pancreatic tail near the splenic hilum concerning for neoplasm. It appears to extend into the adjacent splenic vein. Adrenal glands and kidneys appear normal except for 8 cm cyst arising from lower pole of right kidney. Severe wall thickening is seen involving the second third and fourth portions of the duodenum which may represent inflammation, but neoplastic involvement cannot be excluded. Minimal fluid is noted in the right pericolic gutter. There is no evidence of bowel obstruction. Penile reservoir seen in right lower quadrant. Foley catheter is present  within the urinary bladder. Aortic calcifications are noted without aneurysm formation no significant adenopathy is noted. The appendix appears normal.  IMPRESSION: No significant abnormality is seen in the chest.  Diffuse hepatic metastatic disease is noted, with thrombosis of the right portal vein presumably due to tumor involvement.  3.8 x 2.5 cm lobulated mass is seen in pancreatic tail near splenic hilum concerning for pancreatic malignancy ; it is seen to extend into the adjacent splenic vein.   Electronically Signed   By: Sabino Dick M.D.   On: 08/14/2013 19:15   Ct Cervical Spine Wo Contrast  08/09/2013   CLINICAL DATA:  Fall.  Lethargy and disoriented.  EXAM: CT HEAD WITHOUT CONTRAST  CT CERVICAL SPINE WITHOUT CONTRAST  TECHNIQUE: Multidetector CT imaging of the head and cervical spine was performed following the standard protocol without intravenous contrast. Multiplanar CT image reconstructions of the cervical spine were also generated.  COMPARISON:  None.  FINDINGS: CT HEAD FINDINGS  A low-density cutaneous nodule is present posterior to the right ear measuring 2.0 x 1.6 cm. Moderate generalized atrophy and white matter disease is present bilaterally. The ventricles are proportionate to the degree of atrophy. No significant extra-axial fluid collection is present. No hemorrhage or mass lesion is  evident. No acute infarct is present.  Polyps or mucous retention cysts are present in the left maxillary sinus. The remaining paranasal sinuses and the mastoid air cells are clear. The osseous skull is intact.  CT CERVICAL SPINE FINDINGS  Cervical spine is imaged and skull base through T3-4. Vertebral body heights and alignment are normal. Degenerate endplate changes and uncovertebral spurring is most evident at C6-7. No acute fracture or traumatic subluxation is present. The soft tissues the neck demonstrate atherosclerotic calcifications at the carotid bifurcations. Atherosclerotic calcifications are also present at the dural margin of the vertebral arteries bilaterally. Mild dependent changes are present at the lung apices bilaterally.  IMPRESSION: 1. Moderate atrophy and white matter disease. 2. No acute intracranial abnormality. 3. 2 cm hypodense nodule posterior to the right ear likely represents a sebaceous cyst. 4. Mild degenerative changes at the cervical spine are most evident at C6-7. 5. No acute fracture or traumatic subluxation in the cervical spine.   Electronically Signed   By: Lawrence Santiago M.D.   On: 08/22/2013 15:37   US Abdomen Complete  08/10/2013   CLINICAL DATA:  Right upper quadrant pain. Elevated liver function tests. Renal insufficiency. Diabetes and hypertension.  EXAM: ULTRASOUND ABDOMEN COMPLETE  COMPARISON:  None.  FINDINGS: Gallbladder:  No gallstones identified. Mild diffuse gallbladder wall thickening is seen measuring 5 mm. This is nonspecific in the absence of gallstones, and may be related to hepatocellular disease.  Common bile duct:  Diameter: 6 mm  Liver:  Diffusely heterogeneous echogenicity, with several hypoechoic masses measuring 2 3 cm, suspicious for hepatic metastases. In addition, no definite blood flow is seen within the main portal vein with color Doppler, suspicious for portal vein thrombosis.  IVC:  Suboptimally visualized.  Pancreas:  Not well visualized due to  overlying bowel gas.  Spleen:  Size and appearance within normal limits.  Right Kidney:  Length: 13.5 cm. Echogenicity within normal limits. No mass or hydronephrosis visualized. A large simple appearing cyst is seen in the lower pole measuring 8 cm.  Left Kidney:  Length: 12.0 cm. Echogenicity within normal limits. No mass or hydronephrosis visualized.  Abdominal aorta:  No aneurysm visualized.  Other findings:  None.  IMPRESSION: No evidence of gallstones  or biliary dilatation.  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 Recommend abdomen pelvis CT with contrast for further evaluation.   Electronically Signed   By: Earle Gell M.D.   On: 08/21/2013 16:23   Ct Abdomen Pelvis W Contrast  08/25/2013   CLINICAL DATA:  Liver metastases.  EXAM: CT CHEST, ABDOMEN, AND PELVIS WITH CONTRAST  TECHNIQUE: Multidetector CT imaging of the chest, abdomen and pelvis was performed following the standard protocol during bolus administration of intravenous contrast.  CONTRAST:  2mL OMNIPAQUE IOHEXOL 300 MG/ML  SOLN  COMPARISON:  None.  FINDINGS: CT CHEST FINDINGS  Minimal subsegmental atelectasis is noted posteriorly in both lung bases. No pneumothorax or pleural effusion is noted. No mass or adenopathy is noted in the mediastinum. Coronary artery calcifications are noted. Thoracic aorta and pulmonary arteries appear grossly normal.  CT ABDOMEN AND PELVIS FINDINGS  Multiple rounded low densities are noted throughout the hepatic parenchyma consistent with metastatic disease. The left portal vein appears patent. The right portal vein appears to be occluded presumably by tumor. No gallstones are noted. 3.8 x 2.5 cm lobulated low density is noted in the pancreatic tail near the splenic hilum concerning for neoplasm. It appears to extend into the adjacent splenic vein. Adrenal glands and kidneys appear normal except for 8 cm cyst arising from lower pole of right kidney. Severe wall thickening is seen involving the second  third and fourth portions of the duodenum which may represent inflammation, but neoplastic involvement cannot be excluded. Minimal fluid is noted in the right pericolic gutter. There is no evidence of bowel obstruction. Penile reservoir seen in right lower quadrant. Foley catheter is present within the urinary bladder. Aortic calcifications are noted without aneurysm formation no significant adenopathy is noted. The appendix appears normal.  IMPRESSION: No significant abnormality is seen in the chest.  Diffuse hepatic metastatic disease is noted, with thrombosis of the right portal vein presumably due to tumor involvement.  3.8 x 2.5 cm lobulated mass is seen in pancreatic tail near splenic hilum concerning for pancreatic malignancy ; it is seen to extend into the adjacent splenic vein.   Electronically Signed   By: Sabino Dick M.D.   On: 08/30/2013 19:15    EKG: Independently reviewed. 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 To treat acute pancreatitis, we'll start aggressive IV fluid hydration with IV Lasix as well as one-time dose of IV pamidronate. Patient being only mildly elevated and creatinine may be close to baseline. Continuous Foley catheter. Recheck levels in the morning.  Active Problems:   UTI (lower urinary tract infection): Mild UTI. He actually may have more of a chronic bacteriuria given chronic repeat catheterization. We'll treat regardless    DM type 2 with diabetic peripheral neuropathy: Holding all by mouth medications. For now sliding scale only while he is n.p.o. until he is more fully alert    Pancreatic cancer metastasized to liver: Discussed with patient's son as well as her daughter-in-law. Obviously this is quite shocking and unseen. All new diagnosis. Check CEA 19-9 level, will discuss with oncology in the  morning. Given liver involvement, will check ammonia and INR.  We'll also likely get palliative care involved.    Altered mental status: Secondary to hypercalcemia    Essential hypertension, malignant: I suspect patient she has Lyme depletion because of hypercalcemia. Malignant blood pressures may be from  unable to take by mouth medication plus hypercalcemia  in itself   acute renal failure/? Chronic renal disease: May have some underlying nephropathy secondary to diabetes. Likely some mild acute renal failure secondary to hypercalcemia    Other and unspecified hyperlipidemia: For now holding by mouth meds.    Neurogenic bladder: For now, chronic Foley Transaminitis: This could partially be from liver metastases, but I suspect also may be from severe volume depletion and shock liver. Recheck labs in the morning  Portal vein thrombosis: After checking INR, we'll start Lovenox-full strength  Code Status: DO NOT RESUSCITATE as per son.  Family Communication: Plan discussed patient's son and daughter-in-law at bedside.  Disposition Plan: Likely here several days.  Time spent: 55 minutes  Annita Brod [facility] Pager [phone]

## 2013-08-24 NOTE — Progress Notes (Signed)
08/22/2013 Pharmacy consulted to dose rocephin for UTI/urosepsis.  Wt 84.8 kg. Rocephin 2 gm already ordered by EDP.  Rocephin does not require dosage adjustment for renal function.  Plan: rocephin 1 gm IV q24 to start 24 hrs after first dose in ED.   Eudelia Bunch, Pharm.D. [phone] 08/18/2013 1:54 PM

## 2013-08-24 NOTE — Progress Notes (Signed)
Report called to the ED at 1750 and received report from [facility]

## 2013-08-24 NOTE — ED Notes (Signed)
Pt returned to room  

## 2013-08-24 NOTE — ED Provider Notes (Signed)
CSN: [id number]     Arrival date & time 08/28/2013  1201 History   First MD Initiated Contact with Patient 08/09/2013 1229     Chief Complaint  Patient presents with  . Fall     (Consider location/radiation/quality/duration/timing/severity/associated sxs/prior Treatment) The history is provided by the patient and medical records. No language interpreter was used.    Jesse Banks is a 73 y.o. male  with a hx of DM, HTN, interstitial cystitis presents to the Emergency Department via EMS from home after he was found on the bathroom floor.  He reports his "legs were stiff" and he fell about 4AM.  He is unsure of a possible loss of consciousness. Pt reports he has had urinary retention beginning 4 days ago requiring self cath at home every hour for the last 4 days.  He reports generalized abdominal pain in the last 4 days only occurring with self cath and subsequent penile pain.  He denies fever, chills, headache, neck pain, chest pain, shortness of breath, nausea, vomiting, diarrhea, weakness, dizziness.  Patient is a poor historian, but alert and oriented.  He is also complaining of low back pain but reports this has been occurring with the self caths as well.    Past Medical History  Diagnosis Date  . Diabetes mellitus without complication   . High cholesterol   . Hypertension    History reviewed. No pertinent past surgical history. No family history on file. History  Substance Use Topics  . Smoking status: Never Smoker   . Smokeless tobacco: Not on file  . Alcohol Use: No    Review of Systems  Constitutional: Negative for fever, diaphoresis, appetite change, fatigue and unexpected weight change.  HENT: Negative for mouth sores.   Eyes: Negative for visual disturbance.  Respiratory: Negative for cough, chest tightness, shortness of breath and wheezing.   Cardiovascular: Negative for chest pain.  Gastrointestinal: Negative for nausea, vomiting, abdominal pain, diarrhea and  constipation.  Endocrine: Negative for polydipsia, polyphagia and polyuria.  Genitourinary: Positive for penile pain. Negative for dysuria, urgency, frequency and hematuria.       Urinary retention  Musculoskeletal: Positive for back pain (low back). Negative for neck stiffness.  Skin: Negative for rash.  Allergic/Immunologic: Negative for immunocompromised state.  Neurological: Negative for syncope, light-headedness and headaches.  Hematological: Does not bruise/bleed easily.  Psychiatric/Behavioral: Negative for sleep disturbance. The patient is not nervous/anxious.       Allergies  Review of patient's allergies indicates no known allergies.  Home Medications   Current Outpatient Rx  Name  Route  Sig  Dispense  Refill  . desmopressin (DDAVP) 0.2 MG tablet   Oral   Take 0.2 mg by mouth daily.         . Exenatide (BYETTA 10 MCG PEN [place])   Subcutaneous   Inject 10 Units into the skin daily.          . fenofibrate (TRICOR) 145 MG tablet   Oral   Take 145 mg by mouth daily.         . furosemide (LASIX) 40 MG tablet   Oral   Take 40 mg by mouth.         Marland Kitchen HYDROcodone-acetaminophen (NORCO/VICODIN) 5-325 MG per tablet   Oral   Take 1 tablet by mouth every 6 (six) hours as needed for moderate pain.         . hydrOXYzine (ATARAX/VISTARIL) 25 MG tablet   Oral   Take 25 mg by  mouth at bedtime.         . Insulin Glulisine (APIDRA [place])   Subcutaneous   Inject 20 Units into the skin.         Marland Kitchen isosorbide mononitrate (IMDUR) 30 MG 24 hr tablet   Oral   Take 30 mg by mouth daily.         Marland Kitchen lisinopril (PRINIVIL,ZESTRIL) 10 MG tablet   Oral   Take 10 mg by mouth daily.         . metFORMIN (GLUMETZA) 500 MG (MOD) 24 hr tablet   Oral   Take 500 mg by mouth 2 (two) times daily with a meal.         . MYRBETRIQ 50 MG TB24 tablet   Oral   Take 50 mg by mouth daily.         Marland Kitchen omeprazole (PRILOSEC) 40 MG capsule   Oral   Take 40 mg by mouth daily.           Marland Kitchen oxybutynin (DITROPAN XL) 15 MG 24 hr tablet   Oral   Take 15 mg by mouth at bedtime.         . pentosan polysulfate (ELMIRON) 100 MG capsule   Oral   Take 100 mg by mouth 3 (three) times daily.         . pregabalin (LYRICA) 50 MG capsule   Oral   Take 50 mg by mouth 2 (two) times daily.         . rosuvastatin (CRESTOR) 20 MG tablet   Oral   Take 20 mg by mouth daily.         Marland Kitchen senna-docusate (SENOKOT-S) 8.6-50 MG per tablet   Oral   Take 1 tablet by mouth daily.          BP 184/56  Pulse 99  Temp(Src) 97.8 F (36.6 C) (Oral)  Resp 21  Ht 5\' 7"  (1.702 m)  Wt 187 lb (84.823 kg)  BMI 29.28 kg/m2  SpO2 98% Physical Exam  Nursing note and vitals reviewed. Constitutional: He is oriented to person, place, and time. He appears well-developed and well-nourished. He appears lethargic. No distress.  Awake to voice, ill-appearing, pale  HENT:  Head: Normocephalic and atraumatic.  Nose: Nose normal.  Mouth/Throat: Uvula is midline, oropharynx is clear and moist and mucous membranes are normal. No oropharyngeal exudate.  Eyes: Conjunctivae and EOM are normal. Pupils are equal, round, and reactive to light. No scleral icterus.  Neck: Normal range of motion and full passive range of motion without pain. Neck supple. No spinous process tenderness and no muscular tenderness present. No rigidity. Normal range of motion present.  Full range of motion of the neck without pain No pain to palpation of the midline or paraspinal muscles  Cardiovascular: Normal rate, regular rhythm, normal heart sounds and intact distal pulses.   No murmur heard. Pulses:      Radial pulses are 2+ on the right side, and 2+ on the left side.       Dorsalis pedis pulses are 2+ on the right side, and 2+ on the left side.       Posterior tibial pulses are 2+ on the right side, and 2+ on the left side.  Pulmonary/Chest: Effort normal and breath sounds normal. No accessory muscle usage. No respiratory  distress. He has no decreased breath sounds. He has no wheezes. He has no rhonchi. He has no rales. He exhibits no tenderness and no bony tenderness.  Abdominal: Soft. Normal appearance and bowel sounds are normal. He exhibits no distension and no mass. There is tenderness in the right upper quadrant. There is guarding (with right upper quadrant palpation). There is no rigidity, no rebound and no CVA tenderness.    No ecchymosis Tender to palpation in the right upper quadrant with guarding; no guarding with palpation to any other area of the abdomen Abdomen soft No CVA tenderness  Musculoskeletal: Normal range of motion. He exhibits no edema.       Thoracic back: He exhibits normal range of motion.       Lumbar back: He exhibits normal range of motion.  No tenderness to palpation of the spinous processes of the T-spine or L-spine Mild tenderness to palpation of the bilateral paraspinous muscles of the L-spine  Lymphadenopathy:    He has no cervical adenopathy.  Neurological: He is oriented to person, place, and time. He appears lethargic. No cranial nerve deficit. GCS eye subscore is 3. GCS verbal subscore is 5. GCS motor subscore is 6.  Reflex Scores:      Tricep reflexes are 2+ on the right side and 2+ on the left side.      Bicep reflexes are 2+ on the right side and 2+ on the left side.      Brachioradialis reflexes are 2+ on the right side and 2+ on the left side.      Patellar reflexes are 2+ on the right side and 2+ on the left side.      Achilles reflexes are 2+ on the right side and 2+ on the left side. Speech is slow but understandable, follows commands Normal strength in upper and lower extremities bilaterally including dorsiflexion and plantar flexion, strong and equal grip strength Sensation normal to light and sharp touch Moves extremities without ataxia, coordination intact  Skin: Skin is warm and dry. No rash noted. He is not diaphoretic. No erythema.  Psychiatric: He has a  normal mood and affect.    ED Course  Procedures (including critical care time) Labs Review Labs Reviewed  CBC - Abnormal; Notable for the following:    RBC 4.09 (*)    Hemoglobin 10.4 (*)    HCT 32.7 (*)    MCH 25.4 (*)    All other components within normal limits  URINALYSIS, ROUTINE W REFLEX MICROSCOPIC - Abnormal; Notable for the following:    APPearance HAZY (*)    Hgb urine dipstick LARGE (*)    Protein, ur 100 (*)    Nitrite POSITIVE (*)    Leukocytes, UA SMALL (*)    All other components within normal limits  COMPREHENSIVE METABOLIC PANEL - Abnormal; Notable for the following:    Glucose, Bld 105 (*)    BUN 35 (*)    Creatinine, Ser 1.42 (*)    Calcium >15.0 (*)    Albumin 2.8 (*)    AST 573 (*)    ALT 199 (*)    Alkaline Phosphatase 161 (*)    GFR calc non Af Amer 49 (*)    GFR calc Af Amer 56 (*)    All other components within normal limits  URINE MICROSCOPIC-ADD ON - Abnormal; Notable for the following:    Bacteria, UA MANY (*)    All other components within normal limits  CBG MONITORING, ED - Abnormal; Notable for the following:    Glucose-Capillary 100 (*)    All other components within normal limits  I-STAT CG4 LACTIC ACID, ED - Abnormal;  Notable for the following:    Lactic Acid, Venous 2.45 (*)    All other components within normal limits  CULTURE, BLOOD (ROUTINE X 2)  CULTURE, BLOOD (ROUTINE X 2)  URINE CULTURE  TROPONIN I  CALCIUM, IONIZED  PTH, INTACT AND CALCIUM   Imaging Review Dg Chest 1 View  08/22/2013   CLINICAL DATA:  Pain post trauma  EXAM: CHEST - 1 VIEW  COMPARISON:  May 10, 2013  FINDINGS: There is no edema or consolidation. Heart size and pulmonary vascular normal. No adenopathy. No pneumothorax. No bone lesions.  IMPRESSION: No edema or consolidation.  No pneumothorax.   Electronically Signed   By: Lowella Grip M.D.   On: 08/18/2013 13:49   Dg Lumbar Spine Complete  08/13/2013   CLINICAL DATA:  Low back pain after fall.   EXAM: LUMBAR SPINE - COMPLETE 4+ VIEW  COMPARISON:  CT scan of April 24, 2011.  FINDINGS: No fracture or spondylolisthesis is noted. Disc spaces are well-maintained. Mild degenerative joint disease is seen involving posterior facet joints of L5-S1. Sclerotic density is noted at L2 vertebral body which corresponds to benign enostosis seen on prior CT scan. Minimal anterior osteophyte formation is noted at T12-L1, L2-3 and L3-4.  IMPRESSION: No acute abnormality seen in the lumbar spine.   Electronically Signed   By: Sabino Dick M.D.   On: 09/02/2013 13:51   Ct Head Wo Contrast  08/14/2013   CLINICAL DATA:  Fall.  Lethargy and disoriented.  EXAM: CT HEAD WITHOUT CONTRAST  CT CERVICAL SPINE WITHOUT CONTRAST  TECHNIQUE: Multidetector CT imaging of the head and cervical spine was performed following the standard protocol without intravenous contrast. Multiplanar CT image reconstructions of the cervical spine were also generated.  COMPARISON:  None.  FINDINGS: CT HEAD FINDINGS  A low-density cutaneous nodule is present posterior to the right ear measuring 2.0 x 1.6 cm. Moderate generalized atrophy and white matter disease is present bilaterally. The ventricles are proportionate to the degree of atrophy. No significant extra-axial fluid collection is present. No hemorrhage or mass lesion is evident. No acute infarct is present.  Polyps or mucous retention cysts are present in the left maxillary sinus. The remaining paranasal sinuses and the mastoid air cells are clear. The osseous skull is intact.  CT CERVICAL SPINE FINDINGS  Cervical spine is imaged and skull base through T3-4. Vertebral body heights and alignment are normal. Degenerate endplate changes and uncovertebral spurring is most evident at C6-7. No acute fracture or traumatic subluxation is present. The soft tissues the neck demonstrate atherosclerotic calcifications at the carotid bifurcations. Atherosclerotic calcifications are also present at the dural  margin of the vertebral arteries bilaterally. Mild dependent changes are present at the lung apices bilaterally.  IMPRESSION: 1. Moderate atrophy and white matter disease. 2. No acute intracranial abnormality. 3. 2 cm hypodense nodule posterior to the right ear likely represents a sebaceous cyst. 4. Mild degenerative changes at the cervical spine are most evident at C6-7. 5. No acute fracture or traumatic subluxation in the cervical spine.   Electronically Signed   By: Lawrence Santiago M.D.   On: 09/01/2013 15:37   Ct Cervical Spine Wo Contrast  08/23/2013   CLINICAL DATA:  Fall.  Lethargy and disoriented.  EXAM: CT HEAD WITHOUT CONTRAST  CT CERVICAL SPINE WITHOUT CONTRAST  TECHNIQUE: Multidetector CT imaging of the head and cervical spine was performed following the standard protocol without intravenous contrast. Multiplanar CT image reconstructions of the cervical spine were also  generated.  COMPARISON:  None.  FINDINGS: CT HEAD FINDINGS  A low-density cutaneous nodule is present posterior to the right ear measuring 2.0 x 1.6 cm. Moderate generalized atrophy and white matter disease is present bilaterally. The ventricles are proportionate to the degree of atrophy. No significant extra-axial fluid collection is present. No hemorrhage or mass lesion is evident. No acute infarct is present.  Polyps or mucous retention cysts are present in the left maxillary sinus. The remaining paranasal sinuses and the mastoid air cells are clear. The osseous skull is intact.  CT CERVICAL SPINE FINDINGS  Cervical spine is imaged and skull base through T3-4. Vertebral body heights and alignment are normal. Degenerate endplate changes and uncovertebral spurring is most evident at C6-7. No acute fracture or traumatic subluxation is present. The soft tissues the neck demonstrate atherosclerotic calcifications at the carotid bifurcations. Atherosclerotic calcifications are also present at the dural margin of the vertebral arteries  bilaterally. Mild dependent changes are present at the lung apices bilaterally.  IMPRESSION: 1. Moderate atrophy and white matter disease. 2. No acute intracranial abnormality. 3. 2 cm hypodense nodule posterior to the right ear likely represents a sebaceous cyst. 4. Mild degenerative changes at the cervical spine are most evident at C6-7. 5. No acute fracture or traumatic subluxation in the cervical spine.   Electronically Signed   By: Lawrence Santiago M.D.   On: 2013-09-23 15:37   US Abdomen Complete  09-23-13   CLINICAL DATA:  Right upper quadrant pain. Elevated liver function tests. Renal insufficiency. Diabetes and hypertension.  EXAM: ULTRASOUND ABDOMEN COMPLETE  COMPARISON:  None.  FINDINGS: Gallbladder:  No gallstones identified. Mild diffuse gallbladder wall thickening is seen measuring 5 mm. This is nonspecific in the absence of gallstones, and may be related to hepatocellular disease.  Common bile duct:  Diameter: 6 mm  Liver:  Diffusely heterogeneous echogenicity, with several hypoechoic masses measuring 2 3 cm, suspicious for hepatic metastases. In addition, no definite blood flow is seen within the main portal vein with color Doppler, suspicious for portal vein thrombosis.  IVC:  Suboptimally visualized.  Pancreas:  Not well visualized due to overlying bowel gas.  Spleen:  Size and appearance within normal limits.  Right Kidney:  Length: 13.5 cm. Echogenicity within normal limits. No mass or hydronephrosis visualized. A large simple appearing cyst is seen in the lower pole measuring 8 cm.  Left Kidney:  Length: 12.0 cm. Echogenicity within normal limits. No mass or hydronephrosis visualized.  Abdominal aorta:  No aneurysm visualized.  Other findings:  None.  IMPRESSION: No evidence of gallstones or biliary dilatation.  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 Recommend abdomen pelvis CT with contrast for further evaluation.   Electronically Signed   By: Earle Gell M.D.   On:  23-Sep-2013 16:23    EKG Interpretation   None       MDM   Final diagnoses:  UTI (lower urinary tract infection)  Hypercalcemia  Liver metastases  RUQ abdominal pain  IDDM (insulin dependent diabetes mellitus)  HTN (hypertension)   Jesse Banks presents today after an unwitnessed fall and possible syncopal episode.  Pt reports HX of self cath increasing in the last few days.  Pt is pale and ill appearing.  I am concerned about a possible urosepsis causing the fall this AM.    2:10 PM Pt with elevated LFTs and RUQ abd pain on exam.  Will obtain US.  Lactic acid 2.45. Patient with borderline leukocytosis 10.4. Hemoglobin  10.4 as well. Mild elevation in BUN and creatinine but not above baseline.  Chest x-ray without evidence of pneumonia or pulmonary edema, L-spine without evidence of acute abnormality.  Head and neck CT pending  I personally reviewed the imaging tests through PACS system  I reviewed available ER/hospitalization records through the EMR  4:51 PM Head CT without acute abnormality.  Right upper quadrant abdominal ultrasound with likely liver metastasis. Patient with a history of cancer, unknown primary. Patient remains lethargic. Antibiotics completed. Will proceed with admission.  5:21 PM Discussed with Gevena Barre, MD of [facility] who will admit. Chest and abdominal CT pending.  The patient was discussed with and seen by Dr. Sherwood Gambler who agrees with the treatment plan.      Jarrett Soho Shannon Kirkendall, PA-C 08/23/2013 1722

## 2013-08-24 NOTE — ED Notes (Signed)
Patient to CT.

## 2013-08-24 NOTE — ED Notes (Signed)
Pt to CT

## 2013-08-24 NOTE — ED Notes (Signed)
Pt fell around 0400, Pt from home, lives alone, daughter unable to contact, on arrival pt was supine in bathroom floor, upon EMS arrival pt alert and oriented x4, Per EMS: daughter worried that she thinks the patient is slightly slurred or drowsy, pt hit head, no deformities, and wants to get him evaluated.  Per EMS he passed spinal core assessment per EMS, pt ambulatory upon EMS arrival.  Hx of diabetes and hypertension.  180/60,  CBG 141 16rr [id number]

## 2013-08-24 NOTE — ED Notes (Signed)
Results of lactic acid given to nurse Benjamine Mola in [place]

## 2013-08-25 DIAGNOSIS — I81 Portal vein thrombosis: Secondary | ICD-10-CM | POA: Diagnosis present

## 2013-08-25 DIAGNOSIS — R509 Fever, unspecified: Secondary | ICD-10-CM

## 2013-08-25 DIAGNOSIS — E870 Hyperosmolality and hypernatremia: Secondary | ICD-10-CM

## 2013-08-25 LAB — CBC
HCT: 33.2 % — ABNORMAL LOW (ref 39.0–52.0)
Hemoglobin: 10.3 g/dL — ABNORMAL LOW (ref 13.0–17.0)
MCH: 25.2 pg — AB (ref 26.0–34.0)
MCHC: 31 g/dL (ref 30.0–36.0)
MCV: 81.4 fL (ref 78.0–100.0)
PLATELETS: 300 10*3/uL (ref 150–400)
RBC: 4.08 MIL/uL — AB (ref 4.22–5.81)
RDW: 15.2 % (ref 11.5–15.5)
WBC: 11.6 10*3/uL — ABNORMAL HIGH (ref 4.0–10.5)

## 2013-08-25 LAB — COMPREHENSIVE METABOLIC PANEL
ALBUMIN: 2.6 g/dL — AB (ref 3.5–5.2)
ALT: 204 U/L — AB (ref 0–53)
AST: 446 U/L — AB (ref 0–37)
Alkaline Phosphatase: 160 U/L — ABNORMAL HIGH (ref 39–117)
BILIRUBIN TOTAL: 0.6 mg/dL (ref 0.3–1.2)
BUN: 35 mg/dL — ABNORMAL HIGH (ref 6–23)
CHLORIDE: 104 meq/L (ref 96–112)
CO2: 24 mEq/L (ref 19–32)
Calcium: >15 mg/dL (ref 8.4–10.5)
Creatinine, Ser: 1.38 mg/dL — ABNORMAL HIGH (ref 0.50–1.35)
GFR calc Af Amer: 58 mL/min — ABNORMAL LOW (ref >90)
GFR calc non Af Amer: 50 mL/min — ABNORMAL LOW (ref >90)
Glucose, Bld: 95 mg/dL (ref 70–99)
Potassium: 4.4 mEq/L (ref 3.7–5.3)
SODIUM: 145 meq/L (ref 137–147)
Total Protein: 6.5 g/dL (ref 6.0–8.3)

## 2013-08-25 LAB — GLUCOSE, CAPILLARY
GLUCOSE-CAPILLARY: 104 mg/dL — AB (ref 70–99)
GLUCOSE-CAPILLARY: 92 mg/dL (ref 70–99)
Glucose-Capillary: 100 mg/dL — ABNORMAL HIGH (ref 70–99)
Glucose-Capillary: 100 mg/dL — ABNORMAL HIGH (ref 70–99)
Glucose-Capillary: 113 mg/dL — ABNORMAL HIGH (ref 70–99)
Glucose-Capillary: 114 mg/dL — ABNORMAL HIGH (ref 70–99)

## 2013-08-25 LAB — BASIC METABOLIC PANEL
BUN: 40 mg/dL — AB (ref 6–23)
CO2: 27 mEq/L (ref 19–32)
Calcium: >15 mg/dL (ref 8.4–10.5)
Chloride: 109 mEq/L (ref 96–112)
Creatinine, Ser: 1.48 mg/dL — ABNORMAL HIGH (ref 0.50–1.35)
GFR calc Af Amer: 54 mL/min — ABNORMAL LOW (ref >90)
GFR, EST NON AFRICAN AMERICAN: 46 mL/min — AB (ref >90)
Glucose, Bld: 103 mg/dL — ABNORMAL HIGH (ref 70–99)
Potassium: 4.4 mEq/L (ref 3.7–5.3)
Sodium: 150 mEq/L — ABNORMAL HIGH (ref 137–147)

## 2013-08-25 LAB — HEMOGLOBIN A1C
HEMOGLOBIN A1C: 8.6 % — AB (ref <5.7)
Mean Plasma Glucose: 200 mg/dL — ABNORMAL HIGH (ref <117)

## 2013-08-25 LAB — PTH, INTACT AND CALCIUM
Calcium, Total (PTH): >15 mg/dL (ref 8.4–10.5)
PTH: 6.1 pg/mL — ABNORMAL LOW (ref 14.0–72.0)

## 2013-08-25 LAB — CALCIUM, IONIZED
CALCIUM ION: 2.32 mmol/L — AB (ref 1.13–1.30)
Calcium, Ion: 2.29 mmol/L (ref 1.13–1.30)

## 2013-08-25 LAB — CANCER ANTIGEN 19-9: CA 19-9: 401.7 U/mL — ABNORMAL HIGH (ref <35.0)

## 2013-08-25 MED ORDER — PAMIDRONATE DISODIUM 30 MG/10ML IV SOLN
30.0000 mg | Freq: Once | INTRAVENOUS | Status: AC
  Administered 2013-08-25: 30 mg via INTRAVENOUS
  Filled 2013-08-25: qty 10

## 2013-08-25 MED ORDER — ACETAMINOPHEN 650 MG RE SUPP
325.0000 mg | Freq: Three times a day (TID) | RECTAL | Status: DC | PRN
  Administered 2013-08-25: 325 mg via RECTAL
  Filled 2013-08-25: qty 1

## 2013-08-25 MED ORDER — SODIUM CHLORIDE 0.45 % IV SOLN
INTRAVENOUS | Status: DC
  Administered 2013-08-25: 125 mL/h via INTRAVENOUS

## 2013-08-25 MED ORDER — BIOTENE DRY MOUTH MT LIQD: 15.0000 mL | Freq: Two times a day (BID) | OROMUCOSAL | Status: DC

## 2013-08-25 MED ORDER — METOPROLOL TARTRATE 1 MG/ML IV SOLN
10.0000 mg | Freq: Four times a day (QID) | INTRAVENOUS | Status: DC
  Administered 2013-08-25 (×2): 10 mg via INTRAVENOUS

## 2013-08-25 MED ORDER — ENOXAPARIN SODIUM 100 MG/ML ~~LOC~~ SOLN
85.0000 mg | SUBCUTANEOUS | Status: AC
  Administered 2013-08-25: 85 mg via SUBCUTANEOUS
  Filled 2013-08-25: qty 1

## 2013-08-25 MED ORDER — ENOXAPARIN SODIUM 100 MG/ML ~~LOC~~ SOLN
85.0000 mg | Freq: Two times a day (BID) | SUBCUTANEOUS | Status: DC
  Administered 2013-08-25: 85 mg via SUBCUTANEOUS
  Filled 2013-08-25 (×3): qty 1

## 2013-08-25 MED ORDER — BIOTENE DRY MOUTH MT LIQD
15.0000 mL | Freq: Two times a day (BID) | OROMUCOSAL | Status: DC
  Administered 2013-08-25 (×2): 15 mL via OROMUCOSAL

## 2013-08-25 MED ORDER — HYDRALAZINE HCL 20 MG/ML IJ SOLN: 10.0000 mg | INTRAMUSCULAR | Status: DC | PRN

## 2013-08-25 NOTE — Progress Notes (Signed)
ANTICOAGULATION CONSULT NOTE - Initial Consult  Pharmacy Consult for Lovenox Indication: portal vein thrombosis  No Known Allergies  Patient Measurements: Height: 5\' 6"  (167.6 cm) Weight: 185 lb 6.5 oz (84.1 kg) IBW/kg (Calculated) : 63.8  Vital Signs: Temp: 98.8 F (37.1 C) (02/20 0520) Temp src: Axillary (02/20 0520) BP: 174/77 mmHg (02/20 0520) Pulse Rate: 101 (02/20 0520)  Labs:  Recent Labs  09-23-2013 1254 September 23, 2013 2228 08/25/13 0458  HGB 10.4*  --  10.3*  HCT 32.7*  --  33.2*  PLT 294  --  300  LABPROT  --  15.4*  --   INR  --  1.25  --   CREATININE 1.42*  --  1.38*  TROPONINI <0.30  --   --     Estimated Creatinine Clearance: 50.7 ml/min (by C-G formula based on Cr of 1.38).   Medical History: Past Medical History  Diagnosis Date  . High cholesterol   . Hypertension   . Complication of anesthesia     "takes alot longer to wake up; has delerium" (September 23, 2013)  . Walking pneumonia ~ 2000    "once" (2013/09/23)  . Sleep apnea     "not dx'd but he has it" (2013/09/23)  . IDDM (insulin dependent diabetes mellitus)   . GERD (gastroesophageal reflux disease)   . ZOXWRUEA(540.9)     "monthly" (2013/09/23)  . Barrett's esophagus   . Arthritis     "joints" (23-Sep-2013)  . Anxiety   . Depression   . PTSD (post-traumatic stress disorder)   . Recurrent UTI     "self caths" (2013/09/23)  . Self-catheterizes urinary bladder     Medications:  Prescriptions prior to admission  Medication Sig Dispense Refill  . desmopressin (DDAVP) 0.2 MG tablet Take 0.2 mg by mouth daily.      . Exenatide (BYETTA 10 MCG PEN [place]) Inject 10 Units into the skin daily.       . fenofibrate (TRICOR) 145 MG tablet Take 145 mg by mouth daily.      . furosemide (LASIX) 40 MG tablet Take 40 mg by mouth.      Marland Kitchen HYDROcodone-acetaminophen (NORCO/VICODIN) 5-325 MG per tablet Take 1 tablet by mouth every 6 (six) hours as needed for moderate pain.      . hydrOXYzine (ATARAX/VISTARIL) 25 MG tablet  Take 25 mg by mouth at bedtime.      . Insulin Glulisine (APIDRA [place]) Inject 20 Units into the skin.      Marland Kitchen isosorbide mononitrate (IMDUR) 30 MG 24 hr tablet Take 30 mg by mouth daily.      Marland Kitchen lisinopril (PRINIVIL,ZESTRIL) 10 MG tablet Take 10 mg by mouth daily.      . metFORMIN (GLUMETZA) 500 MG (MOD) 24 hr tablet Take 500 mg by mouth 2 (two) times daily with a meal.      . MYRBETRIQ 50 MG TB24 tablet Take 50 mg by mouth daily.      Marland Kitchen omeprazole (PRILOSEC) 40 MG capsule Take 40 mg by mouth daily.      Marland Kitchen oxybutynin (DITROPAN XL) 15 MG 24 hr tablet Take 15 mg by mouth at bedtime.      . pentosan polysulfate (ELMIRON) 100 MG capsule Take 100 mg by mouth 3 (three) times daily.      . pregabalin (LYRICA) 50 MG capsule Take 50 mg by mouth 2 (two) times daily.      . rosuvastatin (CRESTOR) 20 MG tablet Take 20 mg by mouth daily.      Marland Kitchen  senna-docusate (SENOKOT-S) 8.6-50 MG per tablet Take 1 tablet by mouth daily.        Assessment: 73 y/o male who presented to the ED s/p fall. His daughter found him lying on the bathroom floor. He was noted to have a UTI. 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 CT abdomen shows portal vein thrombosis. Pharmacy consulted to begin Lovenox last night once INR resulted but Lovenox was accidentally missed.   No bleeding noted, CBC is stable since admission.  Goal of Therapy:  Anti-Xa level 0.6-1 units/ml 4hrs after LMWH dose given Monitor platelets by anticoagulation protocol: Yes   Plan:  -Lovenox 85 mg SQ q12h - 1st dose now -CBC q72h while on Lovenox -Monitor for s/sx of bleeding  [facility], Pharm.D., BCPS Clinical Pharmacist Pager: [phone] 08/25/2013 8:15 AM

## 2013-08-25 NOTE — Progress Notes (Signed)
Utilization review completed.  

## 2013-08-25 NOTE — Progress Notes (Signed)
CRITICAL VALUE ALERT  Critical value received: Calcium greater than 15  Date of notification: 08/25/2013  Time of notification:  0630  Critical value read back:yes  Nurse who received alert:  [place] Lachlyn Vanderstelt, RN  MD notified (1st page):  M. Lynch  Time of first page:  [phone]  MD notified (2nd page):  Time of second page:  Responding MD:  M. Donnal Debar  Time MD responded:  [phone]

## 2013-08-25 NOTE — Progress Notes (Signed)
Pt arrive to unit. Pt in no s/s of distress. Pt wearing 2.5L [place] O2. Dayshift nurse receive report via phone from ED. Family at bedside. VS assessed - BP and RR elevated. Physician notified. Awaiting physician to come assess pt and speak with family. Pt lethargic - pt state name with mumbled speech. Callbell within reach. Will continue to monitor.

## 2013-08-25 NOTE — Progress Notes (Signed)
[facility] PROGRESS NOTE  ADEL BURCH QQP:[id number] DOB: Jul 31, 1940 DOA: 2013/08/27 PCP: Dwan Bolt, MD  Assessment/Plan: Principal Problem:   Hypercalcemia: Patient's calcium still remains markedly elevated, however using ionized calcium is a reference point, it has slowly started to trend down. We'll continue IV Lasix and fluids. Will discuss with endocrine as far as other attempts or [facility] further bisphosphonates Active Problems:   UTI (lower urinary tract infection): Cultures preliminary growing out gram-negative rods. Continue IV Rocephin    DM type 2 with diabetic peripheral neuropathy: N.p.o. while altered mental status. Sliding scale every 4 only    Pancreatic cancer metastasized to liver: Discussed with oncology. Paraneoplastic in pancreatic cancer unusual, but given findings, elevated CA 19-none level and liver metastases, consistent. As per conditions, Avastin interventional radiology for a liver biopsy to confirm    Altered mental status: Significant hypercalcemia.    Essential hypertension, malignant: Difficult to control. Secondary hypercalcemia. Using IV hydralazine and Lopressor    Other and unspecified hyperlipidemia: Home meds on hold until alert    Neurogenic bladder: Chronic Foley for now, patient normally self caths    Transaminitis: Secondary to likely shock liver from intravascular volume depletion from hypercalcemia. With aggressive IV hydration, transaminases improving today.    Portal vein thrombosis: On anticoagulation-Lovenox    Hypernatremia: With Lasix and continued hypercalcemia, starting to get very concentrated. Sodium up to 150 today. Have switched to half-normal saline.    Fever, unspecified: No signs of infection other than urinary tract infection, which should be properly treated with Rocephin. Await final cultures. Fever could also be likely from hypercalcemia  Code Status: DO NOT RESUSCITATE  Family Communication: Spoke with  son daughter-in-law to bedside.  Disposition Plan: Very limited. Hopefully we can slowly reverse his calcium levels.   Consultants:  Oncology  Procedures:  None  Antibiotics:  IV Rocephin day 2  HPI/Subjective: Patient earlier today and at times agitated, removed IV, trying to remove Foley  Objective: Filed Vitals:   08/25/13 1839  BP:   Pulse:   Temp: 101.5 F (38.6 C)  Resp:     Intake/Output Summary (Last 24 hours) at 08/25/13 1858 Last data filed at 08/25/13 1131  Gross per 24 hour  Intake 1112.5 ml  Output   3900 ml  Net -2787.5 ml   Filed Weights   2013/08/27 1222 Aug 27, 2013 1935  Weight: 84.823 kg (187 lb) 84.1 kg (185 lb 6.5 oz)    Exam:   General:  Somnolent, agitated at times and confused  Cardiovascular: Regular rhythm, borderline tachycardia  Respiratory: Clear to auscultation bilaterally  Abdomen: Soft, nontender, nondistended, positive bowel sounds  Musculoskeletal: No clubbing or cyanosis, trace edema   Data Reviewed: Basic Metabolic Panel:  Recent Labs Lab Aug 27, 2013 1254 August 27, 2013 2040 08/25/13 0458 08/25/13 1419  NA 139  --  145 150*  K 4.6  --  4.4 4.4  CL 99  --  104 109  CO2 24  --  24 27  GLUCOSE 105*  --  95 103*  BUN 35*  --  35* 40*  CREATININE 1.42*  --  1.38* 1.48*  CALCIUM >15.0* >15.0* >15.0* >15.0*   Liver Function Tests:  Recent Labs Lab Aug 27, 2013 1254 08/25/13 0458  AST 573* 446*  ALT 199* 204*  ALKPHOS 161* 160*  BILITOT 0.7 0.6  PROT 6.8 6.5  ALBUMIN 2.8* 2.6*   No results found for this basename: LIPASE, AMYLASE,  in the last 168 hours  Recent Labs Lab 08/27/2013 2228  AMMONIA 72*   CBC:  Recent Labs Lab 08/23/2013 1254 08/25/13 0458  WBC 10.4 11.6*  HGB 10.4* 10.3*  HCT 32.7* 33.2*  MCV 80.0 81.4  PLT 294 300   Cardiac Enzymes:  Recent Labs Lab 08/20/2013 1254  TROPONINI <0.30   BNP (last 3 results) No results found for this basename: PROBNP,  in the last 8760 hours CBG:  Recent  Labs Lab 08/25/13 0018 08/25/13 0356 08/25/13 0753 08/25/13 1154 08/25/13 1719  GLUCAP 100* 104* 92 100* 114*    Recent Results (from the past 240 hour(s))  CULTURE, BLOOD (ROUTINE X 2)     Status: None   Collection Time    08/15/2013 12:57 PM      Result Value Ref Range Status   Specimen Description BLOOD ARM LEFT   Final   Special Requests     Final   Value: BOTTLES DRAWN AEROBIC AND ANAEROBIC 10CC AER 5CC ANA   Culture  Setup Time     Final   Value: 08/19/2013 16:35     Performed at [facility]   Culture     Final   Value:        BLOOD CULTURE RECEIVED NO GROWTH TO DATE CULTURE WILL BE HELD FOR 5 DAYS BEFORE ISSUING A FINAL NEGATIVE REPORT     Performed at [facility]   Report Status PENDING   Incomplete  CULTURE, BLOOD (ROUTINE X 2)     Status: None   Collection Time    08/22/2013 12:57 PM      Result Value Ref Range Status   Specimen Description BLOOD RIGHT ANTECUBITAL   Final   Special Requests BOTTLES DRAWN AEROBIC AND ANAEROBIC 5CC   Final   Culture  Setup Time     Final   Value: 08/27/2013 16:35     Performed at [facility]   Culture     Final   Value:        BLOOD CULTURE RECEIVED NO GROWTH TO DATE CULTURE WILL BE HELD FOR 5 DAYS BEFORE ISSUING A FINAL NEGATIVE REPORT     Performed at [facility]   Report Status PENDING   Incomplete  URINE CULTURE     Status: None   Collection Time    08/23/2013  1:24 PM      Result Value Ref Range Status   Specimen Description URINE, CATHETERIZED   Final   Special Requests NONE   Final   Culture  Setup Time     Final   Value: 09/02/2013 16:25     Performed at [place]     Final   Value: >=100,000 COLONIES/ML     Performed at [facility]   Culture     Final   Value: [place]     Performed at [facility]   Report Status PENDING   Incomplete     Studies: Dg Chest 1 View  08/20/2013   CLINICAL DATA:  Pain post trauma  EXAM: CHEST - 1  VIEW  COMPARISON:  May 10, 2013  FINDINGS: There is no edema or consolidation. Heart size and pulmonary vascular normal. No adenopathy. No pneumothorax. No bone lesions.  IMPRESSION: No edema or consolidation.  No pneumothorax.   Electronically Signed   By: Lowella Grip M.D.   On: 08/25/2013 13:49   Dg Lumbar Spine Complete  08/12/2013   CLINICAL DATA:  Low back pain after fall.  EXAM:  LUMBAR SPINE - COMPLETE 4+ VIEW  COMPARISON:  CT scan of April 24, 2011.  FINDINGS: No fracture or spondylolisthesis is noted. Disc spaces are well-maintained. Mild degenerative joint disease is seen involving posterior facet joints of L5-S1. Sclerotic density is noted at L2 vertebral body which corresponds to benign enostosis seen on prior CT scan. Minimal anterior osteophyte formation is noted at T12-L1, L2-3 and L3-4.  IMPRESSION: No acute abnormality seen in the lumbar spine.   Electronically Signed   By: Sabino Dick M.D.   On: 08/08/2013 13:51   Ct Head Wo Contrast  08/10/2013   CLINICAL DATA:  Fall.  Lethargy and disoriented.  EXAM: CT HEAD WITHOUT CONTRAST  CT CERVICAL SPINE WITHOUT CONTRAST  TECHNIQUE: Multidetector CT imaging of the head and cervical spine was performed following the standard protocol without intravenous contrast. Multiplanar CT image reconstructions of the cervical spine were also generated.  COMPARISON:  None.  FINDINGS: CT HEAD FINDINGS  A low-density cutaneous nodule is present posterior to the right ear measuring 2.0 x 1.6 cm. Moderate generalized atrophy and white matter disease is present bilaterally. The ventricles are proportionate to the degree of atrophy. No significant extra-axial fluid collection is present. No hemorrhage or mass lesion is evident. No acute infarct is present.  Polyps or mucous retention cysts are present in the left maxillary sinus. The remaining paranasal sinuses and the mastoid air cells are clear. The osseous skull is intact.  CT CERVICAL SPINE FINDINGS   Cervical spine is imaged and skull base through T3-4. Vertebral body heights and alignment are normal. Degenerate endplate changes and uncovertebral spurring is most evident at C6-7. No acute fracture or traumatic subluxation is present. The soft tissues the neck demonstrate atherosclerotic calcifications at the carotid bifurcations. Atherosclerotic calcifications are also present at the dural margin of the vertebral arteries bilaterally. Mild dependent changes are present at the lung apices bilaterally.  IMPRESSION: 1. Moderate atrophy and white matter disease. 2. No acute intracranial abnormality. 3. 2 cm hypodense nodule posterior to the right ear likely represents a sebaceous cyst. 4. Mild degenerative changes at the cervical spine are most evident at C6-7. 5. No acute fracture or traumatic subluxation in the cervical spine.   Electronically Signed   By: Lawrence Santiago M.D.   On: 08/12/2013 15:37   Ct Chest W Contrast  08/23/2013   CLINICAL DATA:  Liver metastases.  EXAM: CT CHEST, ABDOMEN, AND PELVIS WITH CONTRAST  TECHNIQUE: Multidetector CT imaging of the chest, abdomen and pelvis was performed following the standard protocol during bolus administration of intravenous contrast.  CONTRAST:  15mL OMNIPAQUE IOHEXOL 300 MG/ML  SOLN  COMPARISON:  None.  FINDINGS: CT CHEST FINDINGS  Minimal subsegmental atelectasis is noted posteriorly in both lung bases. No pneumothorax or pleural effusion is noted. No mass or adenopathy is noted in the mediastinum. Coronary artery calcifications are noted. Thoracic aorta and pulmonary arteries appear grossly normal.  CT ABDOMEN AND PELVIS FINDINGS  Multiple rounded low densities are noted throughout the hepatic parenchyma consistent with metastatic disease. The left portal vein appears patent. The right portal vein appears to be occluded presumably by tumor. No gallstones are noted. 3.8 x 2.5 cm lobulated low density is noted in the pancreatic tail near the splenic hilum  concerning for neoplasm. It appears to extend into the adjacent splenic vein. Adrenal glands and kidneys appear normal except for 8 cm cyst arising from lower pole of right kidney. Severe wall thickening is seen involving the second third  and fourth portions of the duodenum which may represent inflammation, but neoplastic involvement cannot be excluded. Minimal fluid is noted in the right pericolic gutter. There is no evidence of bowel obstruction. Penile reservoir seen in right lower quadrant. Foley catheter is present within the urinary bladder. Aortic calcifications are noted without aneurysm formation no significant adenopathy is noted. The appendix appears normal.  IMPRESSION: No significant abnormality is seen in the chest.  Diffuse hepatic metastatic disease is noted, with thrombosis of the right portal vein presumably due to tumor involvement.  3.8 x 2.5 cm lobulated mass is seen in pancreatic tail near splenic hilum concerning for pancreatic malignancy ; it is seen to extend into the adjacent splenic vein.   Electronically Signed   By: Sabino Dick M.D.   On: 08/14/2013 19:15   Ct Cervical Spine Wo Contrast  08/23/2013   CLINICAL DATA:  Fall.  Lethargy and disoriented.  EXAM: CT HEAD WITHOUT CONTRAST  CT CERVICAL SPINE WITHOUT CONTRAST  TECHNIQUE: Multidetector CT imaging of the head and cervical spine was performed following the standard protocol without intravenous contrast. Multiplanar CT image reconstructions of the cervical spine were also generated.  COMPARISON:  None.  FINDINGS: CT HEAD FINDINGS  A low-density cutaneous nodule is present posterior to the right ear measuring 2.0 x 1.6 cm. Moderate generalized atrophy and white matter disease is present bilaterally. The ventricles are proportionate to the degree of atrophy. No significant extra-axial fluid collection is present. No hemorrhage or mass lesion is evident. No acute infarct is present.  Polyps or mucous retention cysts are present in the  left maxillary sinus. The remaining paranasal sinuses and the mastoid air cells are clear. The osseous skull is intact.  CT CERVICAL SPINE FINDINGS  Cervical spine is imaged and skull base through T3-4. Vertebral body heights and alignment are normal. Degenerate endplate changes and uncovertebral spurring is most evident at C6-7. No acute fracture or traumatic subluxation is present. The soft tissues the neck demonstrate atherosclerotic calcifications at the carotid bifurcations. Atherosclerotic calcifications are also present at the dural margin of the vertebral arteries bilaterally. Mild dependent changes are present at the lung apices bilaterally.  IMPRESSION: 1. Moderate atrophy and white matter disease. 2. No acute intracranial abnormality. 3. 2 cm hypodense nodule posterior to the right ear likely represents a sebaceous cyst. 4. Mild degenerative changes at the cervical spine are most evident at C6-7. 5. No acute fracture or traumatic subluxation in the cervical spine.   Electronically Signed   By: Lawrence Santiago M.D.   On: 08/20/2013 15:37   US Abdomen Complete  08/13/2013   CLINICAL DATA:  Right upper quadrant pain. Elevated liver function tests. Renal insufficiency. Diabetes and hypertension.  EXAM: ULTRASOUND ABDOMEN COMPLETE  COMPARISON:  None.  FINDINGS: Gallbladder:  No gallstones identified. Mild diffuse gallbladder wall thickening is seen measuring 5 mm. This is nonspecific in the absence of gallstones, and may be related to hepatocellular disease.  Common bile duct:  Diameter: 6 mm  Liver:  Diffusely heterogeneous echogenicity, with several hypoechoic masses measuring 2 3 cm, suspicious for hepatic metastases. In addition, no definite blood flow is seen within the main portal vein with color Doppler, suspicious for portal vein thrombosis.  IVC:  Suboptimally visualized.  Pancreas:  Not well visualized due to overlying bowel gas.  Spleen:  Size and appearance within normal limits.  Right Kidney:   Length: 13.5 cm. Echogenicity within normal limits. No mass or hydronephrosis visualized. A large simple appearing cyst  is seen in the lower pole measuring 8 cm.  Left Kidney:  Length: 12.0 cm. Echogenicity within normal limits. No mass or hydronephrosis visualized.  Abdominal aorta:  No aneurysm visualized.  Other findings:  None.  IMPRESSION: No evidence of gallstones or biliary dilatation.  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 Recommend abdomen pelvis CT with contrast for further evaluation.   Electronically Signed   By: Earle Gell M.D.   On: 08/25/2013 16:23   Ct Abdomen Pelvis W Contrast  08/23/2013   CLINICAL DATA:  Liver metastases.  EXAM: CT CHEST, ABDOMEN, AND PELVIS WITH CONTRAST  TECHNIQUE: Multidetector CT imaging of the chest, abdomen and pelvis was performed following the standard protocol during bolus administration of intravenous contrast.  CONTRAST:  88mL OMNIPAQUE IOHEXOL 300 MG/ML  SOLN  COMPARISON:  None.  FINDINGS: CT CHEST FINDINGS  Minimal subsegmental atelectasis is noted posteriorly in both lung bases. No pneumothorax or pleural effusion is noted. No mass or adenopathy is noted in the mediastinum. Coronary artery calcifications are noted. Thoracic aorta and pulmonary arteries appear grossly normal.  CT ABDOMEN AND PELVIS FINDINGS  Multiple rounded low densities are noted throughout the hepatic parenchyma consistent with metastatic disease. The left portal vein appears patent. The right portal vein appears to be occluded presumably by tumor. No gallstones are noted. 3.8 x 2.5 cm lobulated low density is noted in the pancreatic tail near the splenic hilum concerning for neoplasm. It appears to extend into the adjacent splenic vein. Adrenal glands and kidneys appear normal except for 8 cm cyst arising from lower pole of right kidney. Severe wall thickening is seen involving the second third and fourth portions of the duodenum which may represent inflammation, but  neoplastic involvement cannot be excluded. Minimal fluid is noted in the right pericolic gutter. There is no evidence of bowel obstruction. Penile reservoir seen in right lower quadrant. Foley catheter is present within the urinary bladder. Aortic calcifications are noted without aneurysm formation no significant adenopathy is noted. The appendix appears normal.  IMPRESSION: No significant abnormality is seen in the chest.  Diffuse hepatic metastatic disease is noted, with thrombosis of the right portal vein presumably due to tumor involvement.  3.8 x 2.5 cm lobulated mass is seen in pancreatic tail near splenic hilum concerning for pancreatic malignancy ; it is seen to extend into the adjacent splenic vein.   Electronically Signed   By: Sabino Dick M.D.   On: 08/18/2013 19:15    Scheduled Meds: . antiseptic oral rinse  15 mL Mouth Rinse BID  . cloNIDine  0.2 mg Transdermal Weekly  . enoxaparin (LOVENOX) injection  85 mg Subcutaneous Q12H  . furosemide  40 mg Intravenous BID  . insulin aspart  0-9 Units Subcutaneous 6 times per day  . metoprolol  10 mg Intravenous 4 times per day   Continuous Infusions: . sodium chloride 125 mL/hr (08/25/13 1817)    Principal Problem:   Hypercalcemia Active Problems:   UTI (lower urinary tract infection)   DM type 2 with diabetic peripheral neuropathy   Pancreatic cancer metastasized to liver   Altered mental status   Essential hypertension, malignant   Other and unspecified hyperlipidemia   Neurogenic bladder   Transaminitis   Portal vein thrombosis   Hypernatremia   Fever, unspecified    Time spent: 40 minutes    [facility] Pager [phone]. If 7PM-7AM, please contact night-coverage at [email or web address], password [facility] 08/25/2013, 6:58 PM  LOS: 1 day

## 2013-08-26 LAB — URINE CULTURE: Colony Count: >=100000

## 2013-08-28 NOTE — Care Management Note (Signed)
    Page 1 of 1   08/28/2013     6:36:40 PM   CARE MANAGEMENT NOTE 08/28/2013  Patient:  Jesse Banks, Jesse Banks   Account Number:  [id number]  Date Initiated:  08/28/2013  Documentation initiated by:  Tomi Bamberger  Subjective/Objective Assessment:   dx hpercalceia, uti, pancreatitic ca mets  admit     Action/Plan:   Anticipated DC Date:  09/02/2013   Anticipated DC Plan:  EXPIRED      DC Planning Services  CM consult      Choice offered to / List presented to:             Status of service:  Completed, signed off Medicare Important Message given?   (If response is "NO", the following Medicare IM given date fields will be blank) Date Medicare IM given:   Date Additional Medicare IM given:    Discharge Disposition:  EXPIRED  Per UR Regulation:  Reviewed for med. necessity/level of care/duration of stay  If discussed at [place] of Stay Meetings, dates discussed:    Comments:

## 2013-08-30 LAB — CULTURE, BLOOD (ROUTINE X 2)
Culture: NO GROWTH
Culture: NO GROWTH

## 2013-09-03 NOTE — ED Provider Notes (Signed)
Medical screening examination/treatment/procedure(s) were conducted as a shared visit with non-physician practitioner(s) and myself.  I personally evaluated the patient during the encounter.    Patient with UTI and fall. Found to have hypercalcemia, will give IV fluids. Will need further cancer w/u based on the LFTs and abnormal u/s. Patient is sleepy but arousable. No family available on my exam. Will admit to hospitalist.  Ephraim Hamburger, MD 09/01/2013 [phone]

## 2013-09-03 NOTE — Progress Notes (Signed)
Pt found in room not breathing by nurse tech. Nurse notified. 2 nurses verify no heart beat. Time of death pronounced 0006. Physician and family notified. Post mortem care completed. [facility] called. Awaiting family's arrival.

## 2013-09-03 DEATH — deceased

## 2013-09-04 NOTE — Discharge Summary (Addendum)
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 Patient was found to be supine on the bathroom floor. Initially he was difficult to respond, but the time EMS arrived he was more alert and oriented. His daughter, however felt that he was still somewhat confused and he was brought into the emergency room. Upon arrival to the emergency room, he was evaluated and lab work noted a lactic acid level of 2.45, a minimal anion gap of 15 with a BUN of 35 and creatinine of 1.42-unclear baseline, transaminases with a a ST of 573 and ALT of 199, alkaline phosphatase of 161. Most concerning was a calcium level of greater than 15-unable to get number. We did review his old records & found a previous creatinine of 1.5, so it's suspected he had likely had a chronic kidney disease of stg 2.Patient also noted to have a UTI. An abdominal ultrasound was concerning for metastatic liver lesions. He was incidentally noted to have a Right bundle branch block on EKG.  Hospitalists were contacted  and case was discussed. 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 An abdominal and chest CT were done which noted clear lung fields but evidence of a pancreatic mass in the tail extending to the splenic vein. 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 Patient was started on aggressive IV fluids. He is also noted to have malignant hypertension with systolic blood pressure of 180.  His transaminase elevation was felt to be secondary to shock liver caused by massive volume depletion caused by the hypercalcemia (which was felt to be from a paraneoplastic syndrome caused by his pancreatic cancer)   Hospital Course:  After some extensive discussion with the patient's son and daughter-in-law, taken from the patient was to be a DO NOT RESUSCITATE. Patient was started on antibiotics for his UTI. Aggressive attempts with IV fluids, Lasix and IV pamidronate we'll made an attempt to lower the patient's calcium levels. The patient was initially responsive, although confused on admission.  This was felt to be a metabolic encephalopathy, caused by the hypercalcemia.  The following day, the pt was unresponsive in a comatose state. Unfortunately, his calcium levels remained markedly elevated. Patient was found to have expired after midnight on 22-Sep-2022. After speaking to the family, they were happy with the patient's care and comfort in that he did not suffer.   Time: 12:06 AM on September 22, 2022 Time spent: 20 minutes Signed:  Annita Brod  [facility] 09/04/2013, 2:33 PM

## 2014-03-28 IMAGING — US US ABDOMEN COMPLETE
1 series · 13 of 25 positions shown · non-contrast
Comparison: None.

CLINICAL DATA: Right upper quadrant pain. Elevated liver function
tests. Renal insufficiency. Diabetes and hypertension.

EXAM:
ULTRASOUND ABDOMEN COMPLETE

[Series 1: us abdomen complete · 0.29mm/px · 94 acquisitions, 13 frames shown]
[im 1/94]
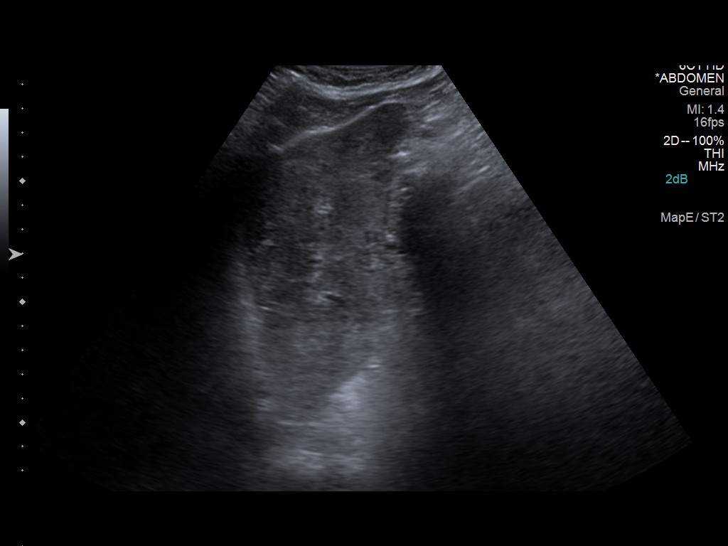
[im 8/94]
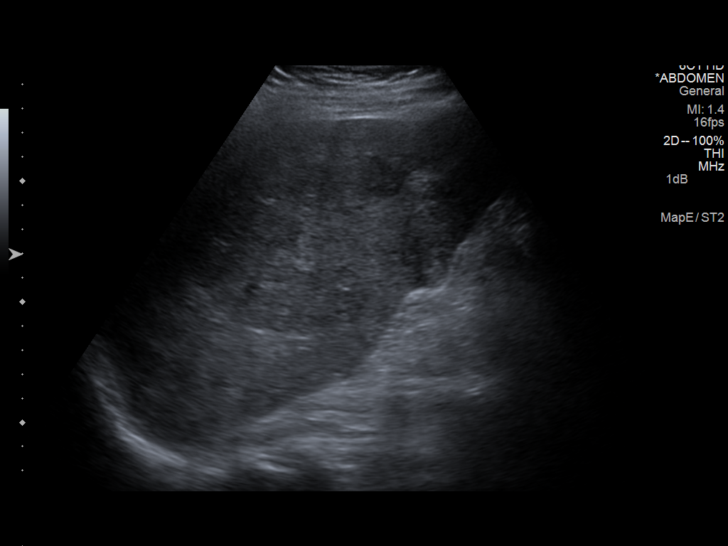
[im 16/94]
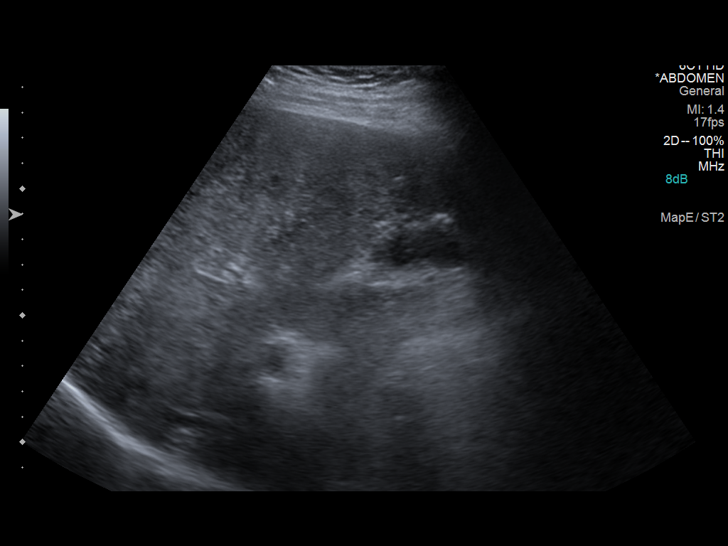
[im 24/94]
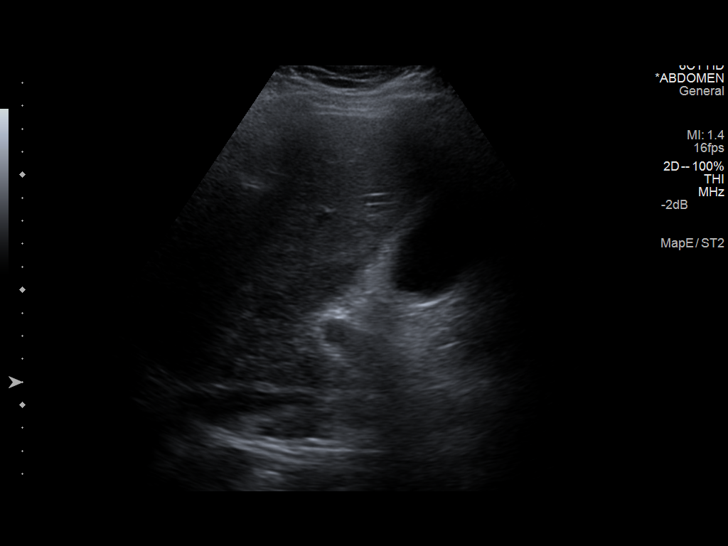
[im 32/94]
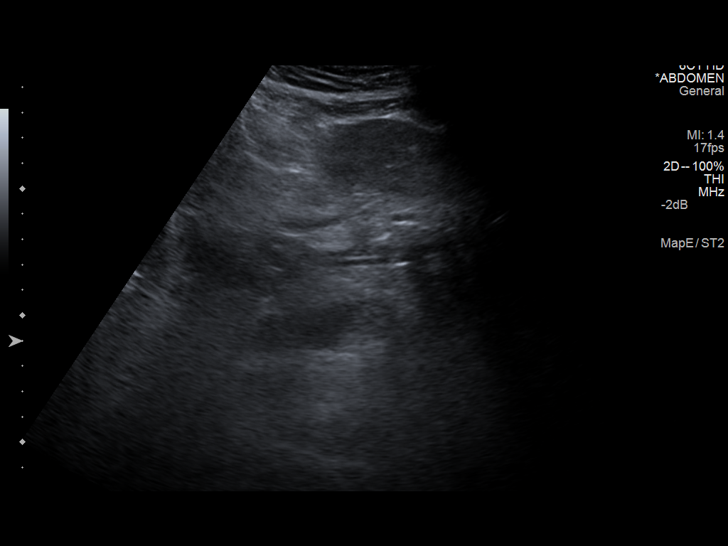
[im 39/94]
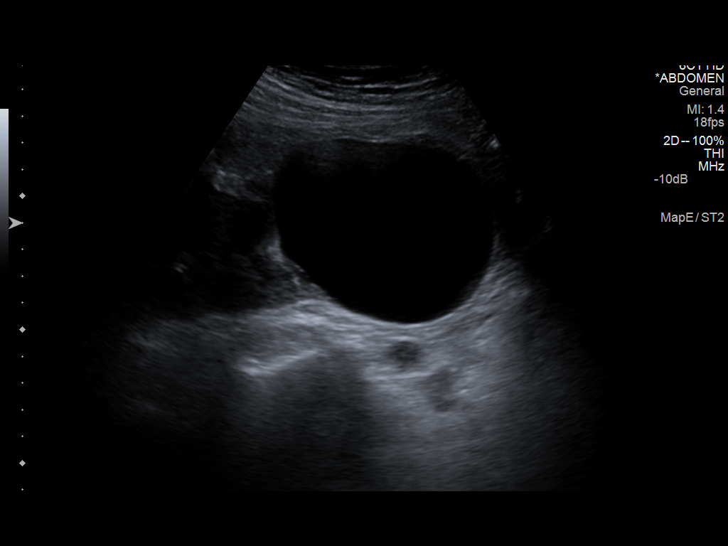
[im 47/94]
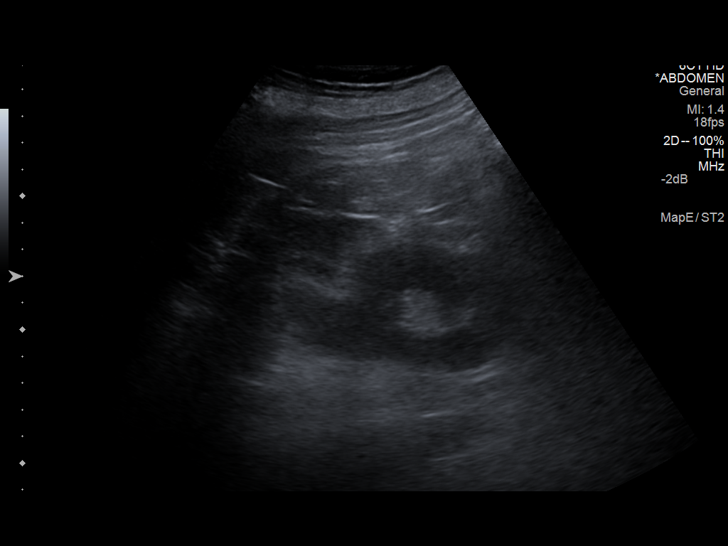
[im 55/94]
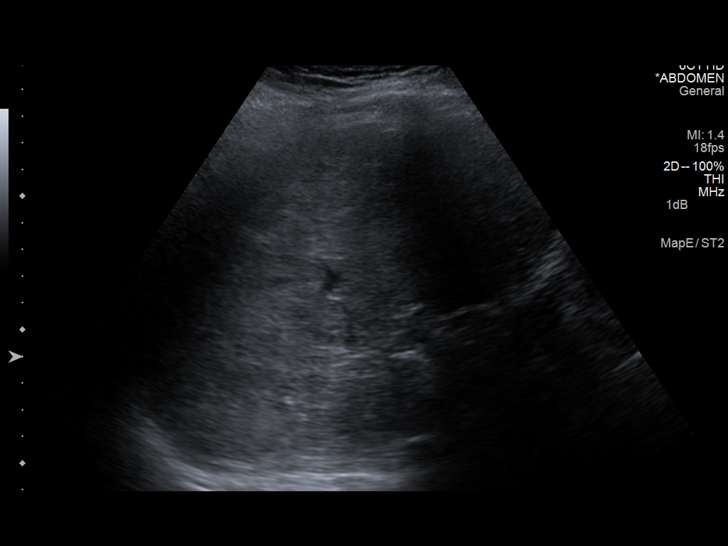
[im 63/94]
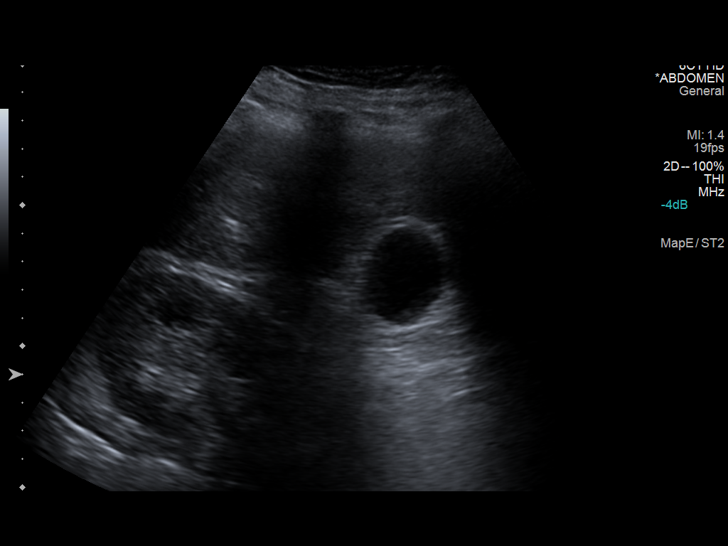
[im 70/94]
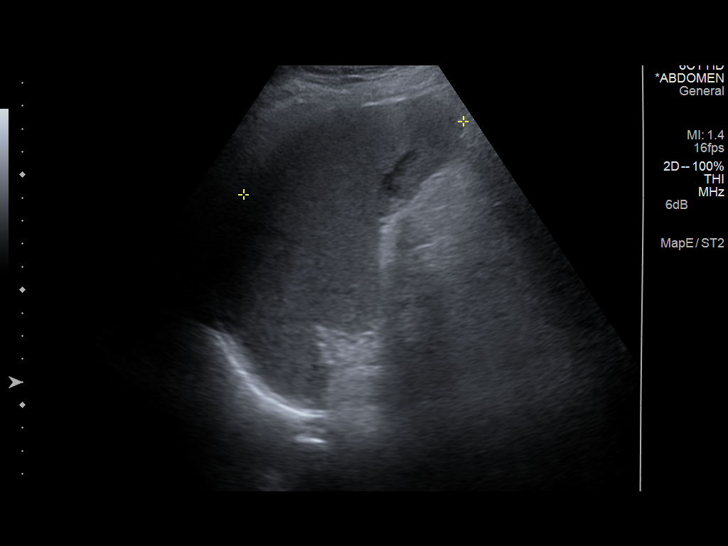
[im 78/94]
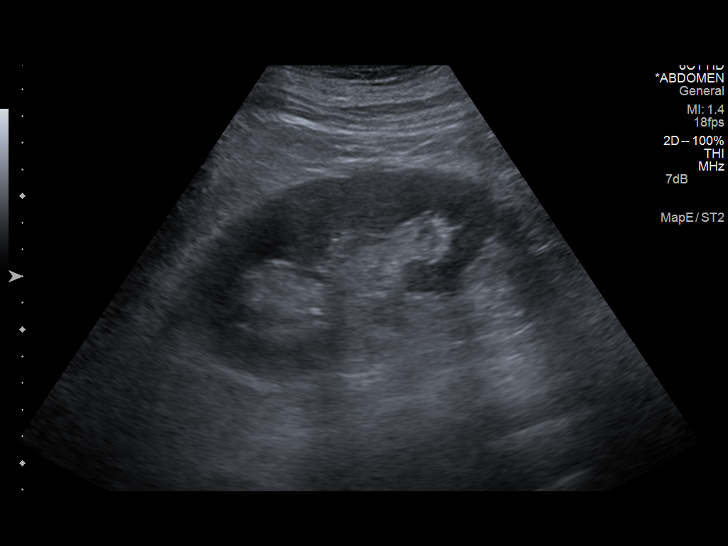
[im 86/94]
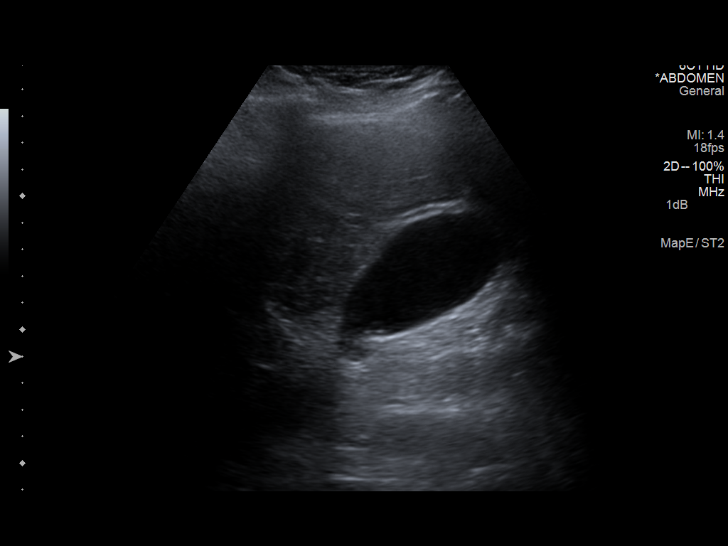
[im 94/94]
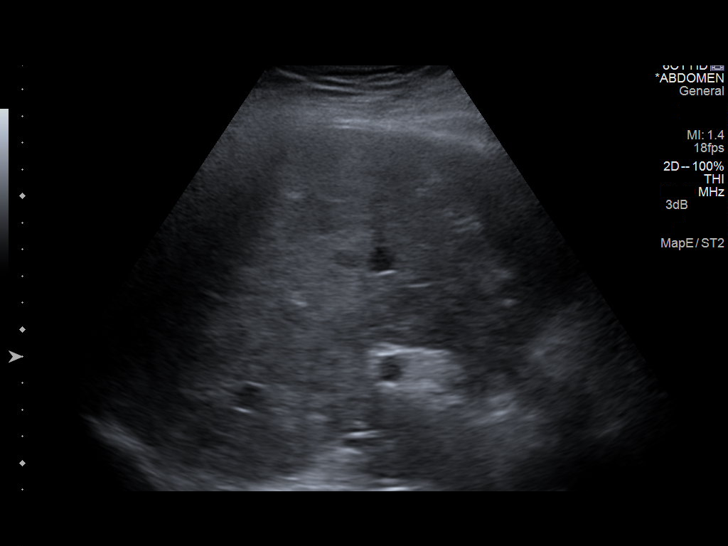

[13 of 25 positions shown; findings below may reference images not displayed]

FINDINGS: Gallbladder:

No gallstones identified. Mild diffuse gallbladder wall thickening
is seen measuring 5 mm. This is nonspecific in the absence of
gallstones, and may be related to hepatocellular disease.

Common bile duct:

Diameter: 6 mm

Liver:

Diffusely heterogeneous echogenicity, with several hypoechoic masses
measuring 2 3 cm, suspicious for hepatic metastases. In addition, no
definite blood flow is seen within the main portal vein with color
Doppler, suspicious for portal vein thrombosis.

IVC:

Suboptimally visualized.

Pancreas:

Not well visualized due to overlying bowel gas.

Spleen:

Size and appearance within normal limits.

Right Kidney:

Length: 13.5 cm. Echogenicity within normal limits. No mass or
hydronephrosis visualized. A large simple appearing cyst is seen in
the lower pole measuring 8 cm.

Left Kidney:

Length: 12.0 cm. Echogenicity within normal limits. No mass or
hydronephrosis visualized.

Abdominal aorta:

No aneurysm visualized.

Other findings:

None.
IMPRESSION: No evidence of gallstones or biliary dilatation.

Suspect hepatic metastases and portal vein thrombosis. Recommend
abdomen pelvis CT with contrast for further evaluation.

## 2014-03-28 IMAGING — CT CT HEAD W/O CM
2 of 5 series · 12 of 47 positions shown, 15 images · non-contrast
Comparison: None.

CLINICAL DATA: Fall.  Lethargy and disoriented.

EXAM:
CT HEAD WITHOUT CONTRAST
CT CERVICAL SPINE WITHOUT CONTRAST
TECHNIQUE: Multidetector CT imaging of the head and cervical spine was
performed following the standard protocol without intravenous
contrast. Multiplanar CT image reconstructions of the cervical spine
were also generated.

[Series 8: coronals · coronal · 0.43mm/px · 3 of 83 slices shown]
[im 28/83  brain]
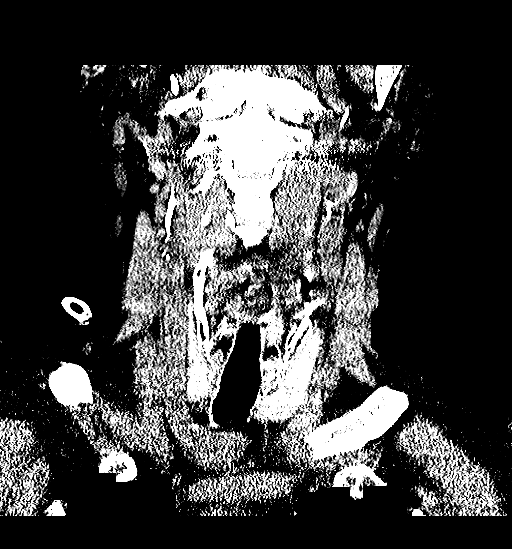
[im 37/83  brain]
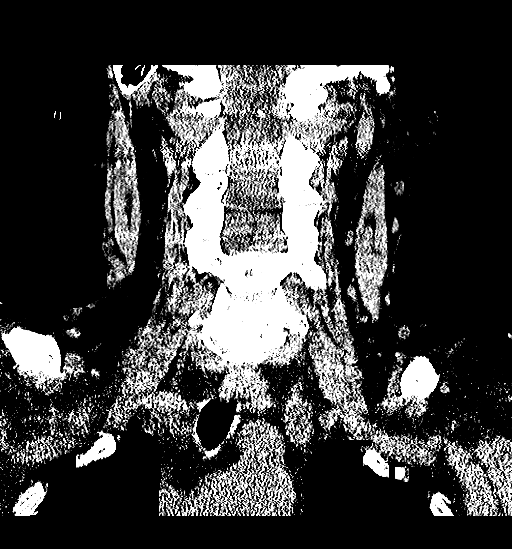
[im 46/83  brain]
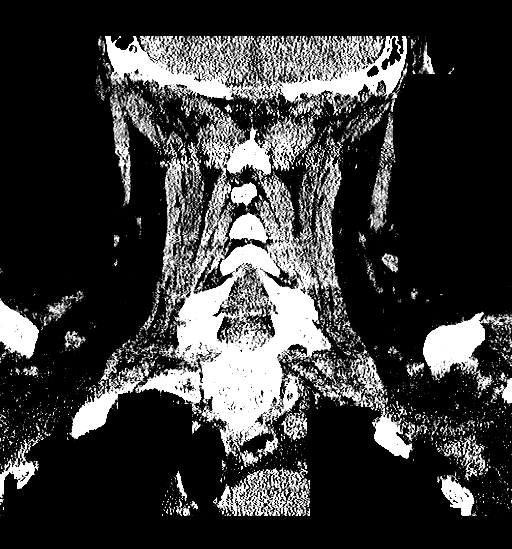

[Series 10: orthogonals · axial · 0.39mm/px · z∈[-351,-189]mm · 9 of 102 slices shown, 12 images]
[im 9/102  brain]
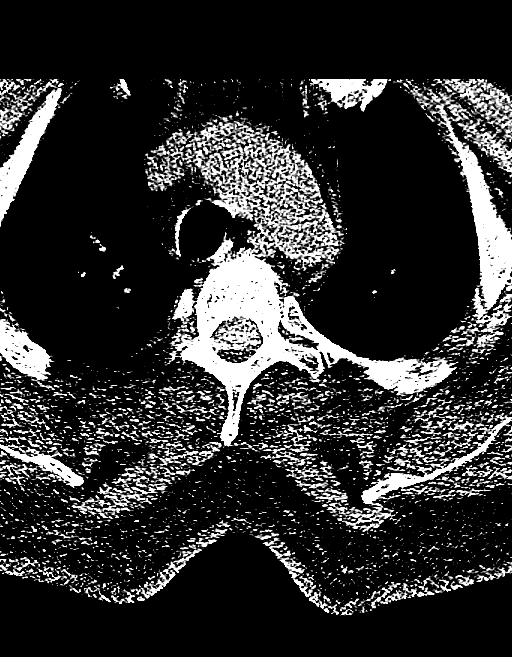
[im 9/102  bone]
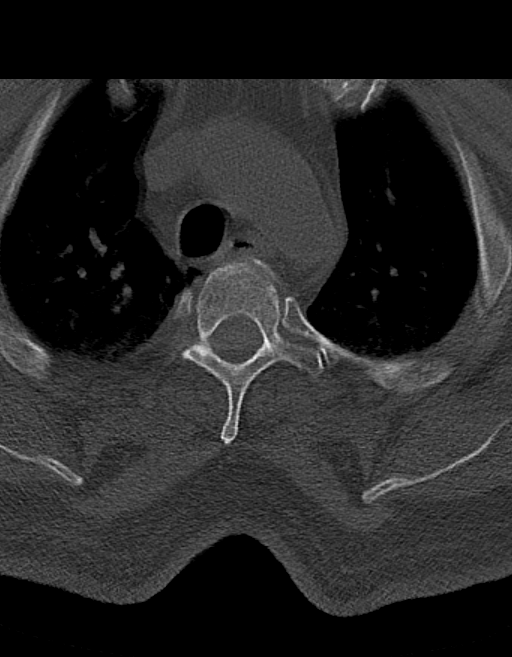
[im 17/102  brain]
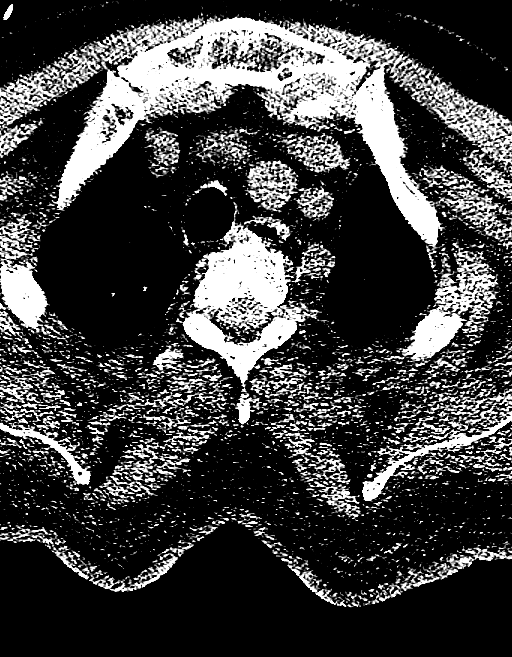
[im 34/102  brain]
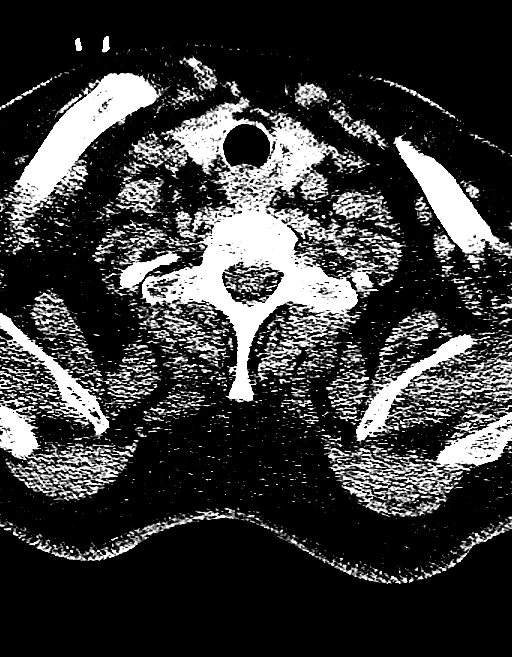
[im 43/102  brain]
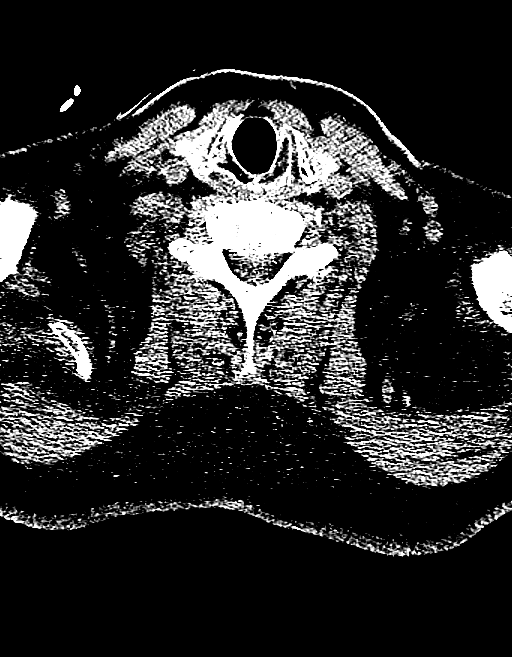
[im 51/102  brain]
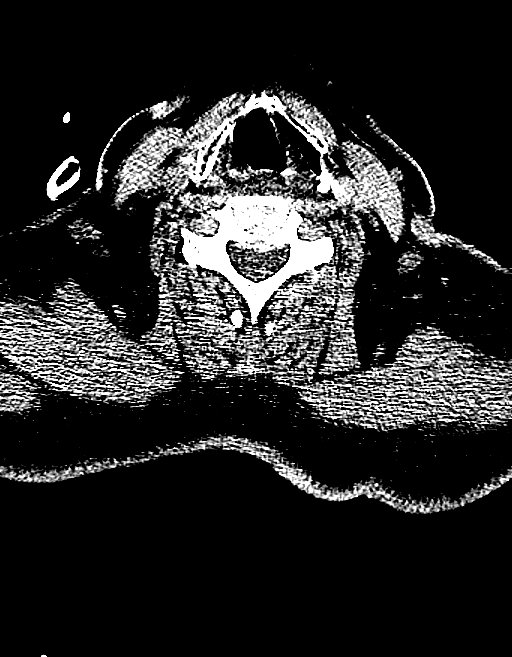
[im 51/102  bone]
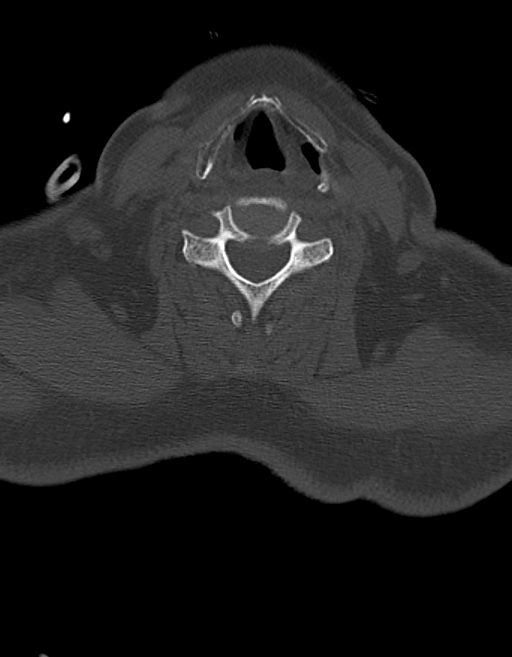
[im 59/102  brain]
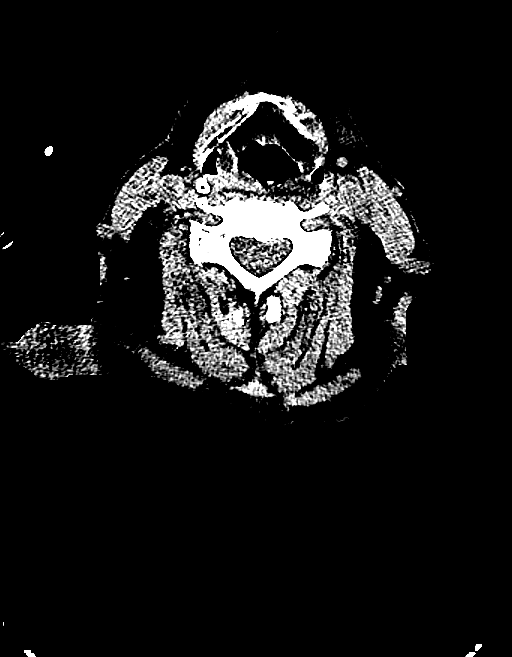
[im 68/102  brain]
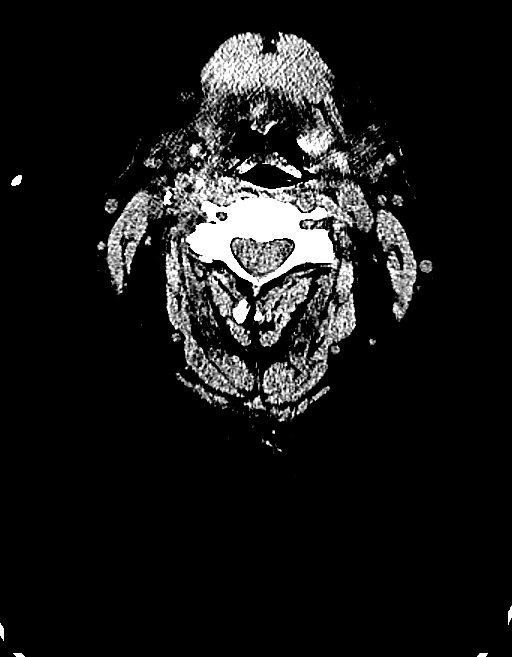
[im 85/102  brain]
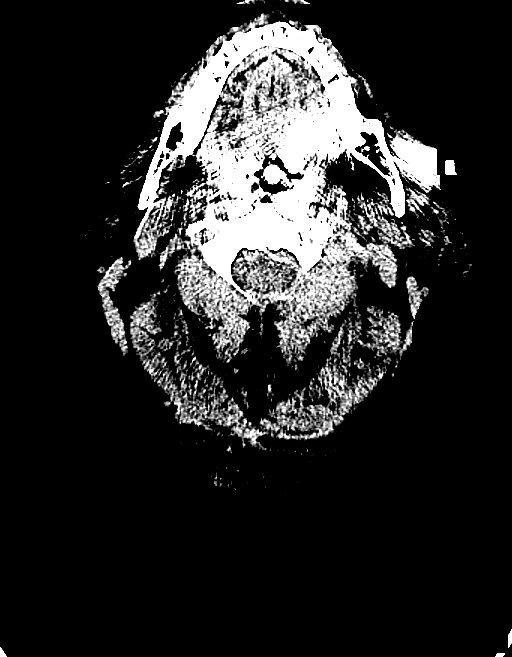
[im 93/102  brain]
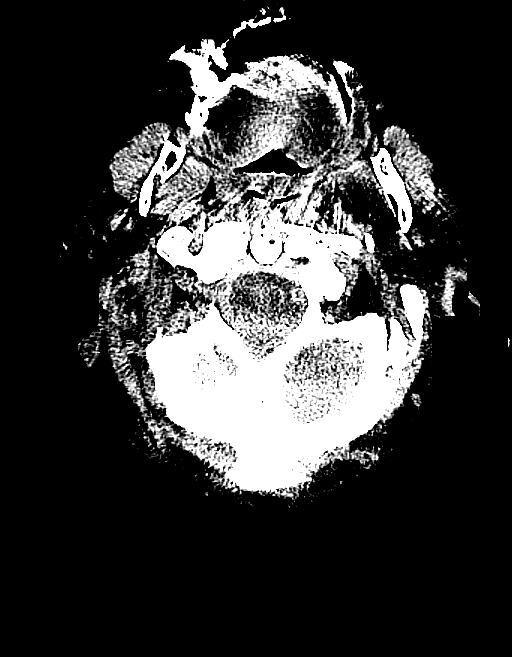
[im 93/102  bone]
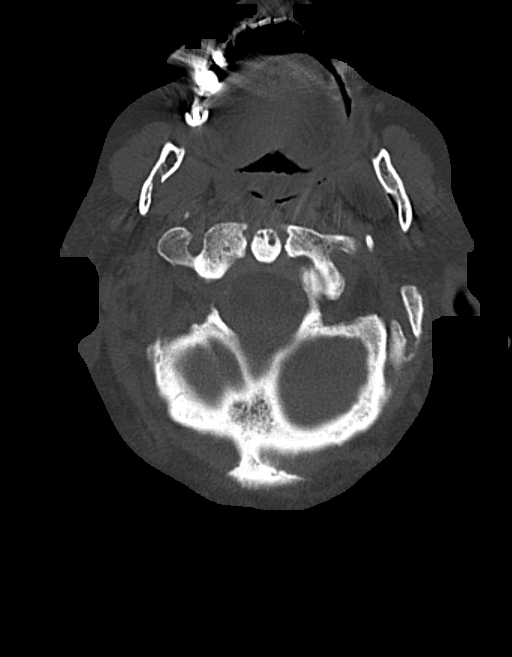

[12 of 47 positions shown; findings below may reference images not displayed]

FINDINGS: CT HEAD FINDINGS

A low-density cutaneous nodule is present posterior to the right ear
measuring 2.0 x 1.6 cm. Moderate generalized atrophy and white
matter disease is present bilaterally. The ventricles are
proportionate to the degree of atrophy. No significant extra-axial
fluid collection is present. No hemorrhage or mass lesion is
evident. No acute infarct is present.

Polyps or mucous retention cysts are present in the left maxillary
sinus. The remaining paranasal sinuses and the mastoid air cells are
clear. The osseous skull is intact.

CT CERVICAL SPINE FINDINGS

Cervical spine is imaged and skull base through T3-4. Vertebral body
heights and alignment are normal. Degenerate endplate changes and
uncovertebral spurring is most evident at C6-7. No acute fracture or
traumatic subluxation is present. The soft tissues the neck
demonstrate atherosclerotic calcifications at the carotid
bifurcations. Atherosclerotic calcifications are also present at the
dural margin of the vertebral arteries bilaterally. Mild dependent
changes are present at the lung apices bilaterally.
IMPRESSION: 1. Moderate atrophy and white matter disease.
2. No acute intracranial abnormality.
3. 2 cm hypodense nodule posterior to the right ear likely
represents a sebaceous cyst.
4. Mild degenerative changes at the cervical spine are most evident
at C6-7.
5. No acute fracture or traumatic subluxation in the cervical spine.
